# Patient Record
Sex: Female | Born: 1965 | Race: White | Hispanic: No | Marital: Married | Smoking: Former smoker
Health system: Southern US, Community
[De-identification: ages and names within clinical notes are randomized; demographics above are authoritative.]

## PROBLEM LIST (undated history)

## (undated) DIAGNOSIS — M545 Low back pain, unspecified: Secondary | ICD-10-CM

## (undated) HISTORY — PX: COLONOSCOPY: SHX174

## (undated) HISTORY — PX: TONSILLECTOMY: SUR1361

## (undated) HISTORY — PX: HYSTERECTOMY: SHX81

## (undated) HISTORY — DX: Low back pain, unspecified: M54.50

---

## 2002-08-09 HISTORY — PX: LIPOSUCTION: SHX10

## 2002-08-09 HISTORY — PX: ABDOMINOPLASTY: SUR9

## 2014-08-30 ENCOUNTER — Ambulatory Visit: Payer: Self-pay

## 2014-08-30 NOTE — Pre-Procedure Instructions (Signed)
   No testing ordered by surgeon   No anesthesia guidelines for testing

## 2014-09-04 ENCOUNTER — Ambulatory Visit: Payer: Self-pay | Admitting: Anesthesiology

## 2014-09-04 ENCOUNTER — Other Ambulatory Visit: Payer: Self-pay

## 2014-09-04 ENCOUNTER — Encounter: Payer: Self-pay | Admitting: Anesthesiology

## 2014-09-04 ENCOUNTER — Encounter
Admission: RE | Disposition: A | Discharge status: Home / Self Care | Payer: Self-pay | Source: Ambulatory Visit | Attending: Plastic Surgery

## 2014-09-04 ENCOUNTER — Ambulatory Visit
Admission: RE | Admit: 2014-09-04 | Discharge: 2014-09-04 | Disposition: A | Discharge status: Home / Self Care | Payer: Self-pay | Source: Ambulatory Visit | Attending: Plastic Surgery | Admitting: Plastic Surgery

## 2014-09-04 ENCOUNTER — Ambulatory Visit: Payer: Self-pay | Admitting: Plastic Surgery

## 2014-09-04 DIAGNOSIS — E881 Lipodystrophy, not elsewhere classified: Secondary | ICD-10-CM | POA: Insufficient documentation

## 2014-09-04 DIAGNOSIS — Z411 Encounter for cosmetic surgery: Secondary | ICD-10-CM | POA: Insufficient documentation

## 2014-09-04 DIAGNOSIS — Z87891 Personal history of nicotine dependence: Secondary | ICD-10-CM | POA: Insufficient documentation

## 2014-09-04 HISTORY — PX: LIPOSUCTION, FLANK (COSMETIC): SHX4745

## 2014-09-04 HISTORY — PX: ABDOMINOPLASTY, MINI (COSMETIC): SHX3012

## 2014-09-04 SURGERY — ABDOMINOPLASTY, MINI (COSMETIC)
Anesthesia: Anesthesia General | Site: Abdomen | Wound class: Clean

## 2014-09-04 MED ORDER — ROCURONIUM BROMIDE 50 MG/5ML IV SOLN
INTRAVENOUS | Status: DC | PRN
Start: 2014-09-04 — End: 2014-09-04
  Administered 2014-09-04: 50 mg via INTRAVENOUS

## 2014-09-04 MED ORDER — BUPIVACAINE HCL (PF) 0.5 % IJ SOLN
INTRAMUSCULAR | Status: DC
Start: 2014-09-04 — End: 2014-09-04
  Filled 2014-09-04: qty 20

## 2014-09-04 MED ORDER — GABAPENTIN 300 MG PO CAPS
ORAL_CAPSULE | ORAL | Status: AC
Start: 2014-09-04 — End: 2014-09-04
  Administered 2014-09-04: 300 mg via ORAL
  Filled 2014-09-04: qty 1

## 2014-09-04 MED ORDER — FENTANYL CITRATE 0.05 MG/ML IJ SOLN
INTRAMUSCULAR | Status: AC
Start: 2014-09-04 — End: ?
  Filled 2014-09-04: qty 2

## 2014-09-04 MED ORDER — OXYCODONE HCL ER 10 MG PO T12A
10.0000 mg | EXTENDED_RELEASE_TABLET | Freq: Once | ORAL | Status: AC
Start: 2014-09-04 — End: 2014-09-04

## 2014-09-04 MED ORDER — GENTAMICIN SULFATE 40 MG/ML IJ SOLN
INTRAMUSCULAR | Status: DC
Start: 2014-09-04 — End: 2014-09-04
  Filled 2014-09-04: qty 2

## 2014-09-04 MED ORDER — ACETAMINOPHEN 500 MG PO TABS
ORAL_TABLET | ORAL | Status: AC
Start: 2014-09-04 — End: 2014-09-04
  Administered 2014-09-04: 1000 mg via ORAL
  Filled 2014-09-04: qty 2

## 2014-09-04 MED ORDER — BUPIVACAINE HCL 0.5 % IJ SOLN
INTRAMUSCULAR | Status: DC | PRN
Start: 2014-09-04 — End: 2014-09-04
  Administered 2014-09-04: 4 mL
  Administered 2014-09-04: 15 mL

## 2014-09-04 MED ORDER — HYDROMORPHONE HCL 2 MG PO TABS
2.0000 mg | ORAL_TABLET | Freq: Once | ORAL | Status: AC | PRN
Start: 2014-09-04 — End: 2014-09-04

## 2014-09-04 MED ORDER — GABAPENTIN 300 MG PO CAPS
300.0000 mg | ORAL_CAPSULE | Freq: Once | ORAL | Status: AC
Start: 2014-09-04 — End: 2014-09-04

## 2014-09-04 MED ORDER — FAMOTIDINE 20 MG/2ML IV SOLN
INTRAVENOUS | Status: AC
Start: 2014-09-04 — End: 2014-09-04
  Administered 2014-09-04: 20 mg via INTRAVENOUS
  Filled 2014-09-04: qty 2

## 2014-09-04 MED ORDER — LIDOCAINE HCL 1 % IJ SOLN
INTRAMUSCULAR | Status: DC | PRN
Start: 2014-09-04 — End: 2014-09-04
  Administered 2014-09-04: 13:00:00 2000 mL via SUBCUTANEOUS
  Administered 2014-09-04: 14:00:00 1679 mL via SUBCUTANEOUS

## 2014-09-04 MED ORDER — HYDROMORPHONE HCL 2 MG PO TABS
ORAL_TABLET | ORAL | Status: AC
Start: 2014-09-04 — End: 2014-09-04
  Administered 2014-09-04: 2 mg via ORAL
  Filled 2014-09-04: qty 1

## 2014-09-04 MED ORDER — LIDOCAINE-EPINEPHRINE 1 %-1:100000 IJ SOLN
INTRAMUSCULAR | Status: DC
Start: 2014-09-04 — End: 2014-09-04
  Filled 2014-09-04: qty 20

## 2014-09-04 MED ORDER — MIDAZOLAM HCL 2 MG/2ML IJ SOLN
INTRAMUSCULAR | Status: DC | PRN
Start: 2014-09-04 — End: 2014-09-04
  Administered 2014-09-04: 2 mg via INTRAVENOUS

## 2014-09-04 MED ORDER — ACETAMINOPHEN 500 MG PO TABS
1000.0000 mg | ORAL_TABLET | Freq: Once | ORAL | Status: AC
Start: 2014-09-04 — End: 2014-09-04

## 2014-09-04 MED ORDER — LIDOCAINE HCL 1 % IJ SOLN
INTRAMUSCULAR | Status: DC
Start: 2014-09-04 — End: 2014-09-04
  Filled 2014-09-04: qty 40

## 2014-09-04 MED ORDER — MEPERIDINE HCL 25 MG/ML IJ SOLN
INTRAMUSCULAR | Status: AC
Start: 2014-09-04 — End: 2014-09-04
  Administered 2014-09-04: 12.5 mg via INTRAVENOUS
  Filled 2014-09-04: qty 1

## 2014-09-04 MED ORDER — ROCURONIUM BROMIDE 50 MG/5ML IV SOLN
INTRAVENOUS | Status: AC
Start: 2014-09-04 — End: ?
  Filled 2014-09-04: qty 5

## 2014-09-04 MED ORDER — CEFAZOLIN 1 GM MBP (CNR)
Status: DC
Start: 2014-09-04 — End: 2014-09-04
  Filled 2014-09-04: qty 100

## 2014-09-04 MED ORDER — OXYCODONE HCL ER 10 MG PO T12A
EXTENDED_RELEASE_TABLET | ORAL | Status: AC
Start: 2014-09-04 — End: 2014-09-04
  Administered 2014-09-04: 10 mg via ORAL
  Filled 2014-09-04: qty 1

## 2014-09-04 MED ORDER — PROPOFOL 10 MG/ML IV EMUL
INTRAVENOUS | Status: DC | PRN
Start: 2014-09-04 — End: 2014-09-04
  Administered 2014-09-04: 150 mg via INTRAVENOUS

## 2014-09-04 MED ORDER — PROMETHAZINE HCL 25 MG/ML IJ SOLN
6.2500 mg | Freq: Once | INTRAMUSCULAR | Status: DC | PRN
Start: 2014-09-04 — End: 2014-09-04

## 2014-09-04 MED ORDER — EPINEPHRINE HCL 1 MG/ML IJ SOLN
INTRAMUSCULAR | Status: DC
Start: 2014-09-04 — End: 2014-09-04
  Filled 2014-09-04: qty 3

## 2014-09-04 MED ORDER — CEFADROXIL 500 MG PO CAPS
ORAL_CAPSULE | ORAL | 0 refills | Status: DC
Start: 2014-09-04 — End: 2017-11-24
  Filled 2014-09-04: qty 14, 7d supply, fill #0

## 2014-09-04 MED ORDER — GLYCOPYRROLATE 0.2 MG/ML IJ SOLN
INTRAMUSCULAR | Status: DC | PRN
Start: 2014-09-04 — End: 2014-09-04
  Administered 2014-09-04: 0.2 mg via INTRAVENOUS

## 2014-09-04 MED ORDER — MEPERIDINE HCL 25 MG/ML IJ SOLN
12.5000 mg | Freq: Once | INTRAMUSCULAR | Status: AC | PRN
Start: 2014-09-04 — End: 2014-09-04

## 2014-09-04 MED ORDER — SODIUM CHLORIDE 0.9 % IV MBP
1.0000 g | Freq: Three times a day (TID) | INTRAVENOUS | Status: DC
Start: 2014-09-04 — End: 2014-09-04

## 2014-09-04 MED ORDER — LACTATED RINGERS IV SOLN
INTRAVENOUS | Status: DC
Start: 2014-09-04 — End: 2014-09-04
  Administered 2014-09-04: 1000 mL via INTRAVENOUS

## 2014-09-04 MED ORDER — HYDROMORPHONE HCL 1 MG/ML IJ SOLN
0.5000 mg | INTRAMUSCULAR | Status: DC | PRN
Start: 2014-09-04 — End: 2014-09-04

## 2014-09-04 MED ORDER — FENTANYL CITRATE 0.05 MG/ML IJ SOLN
INTRAMUSCULAR | Status: DC | PRN
Start: 2014-09-04 — End: 2014-09-04
  Administered 2014-09-04: 100 ug via INTRAVENOUS
  Administered 2014-09-04: 50 ug via INTRAVENOUS

## 2014-09-04 MED ORDER — LACTATED RINGERS IV SOLN
75.0000 mL/h | INTRAVENOUS | Status: DC
Start: 2014-09-04 — End: 2014-09-04

## 2014-09-04 MED ORDER — SCOPOLAMINE 1 MG/3DAYS TD PT72
MEDICATED_PATCH | TRANSDERMAL | Status: DC
Start: 2014-09-04 — End: 2014-09-04
  Filled 2014-09-04: qty 1

## 2014-09-04 MED ORDER — LIDOCAINE HCL 1 % IJ SOLN
INTRAMUSCULAR | Status: DC
Start: 2014-09-04 — End: 2014-09-04
  Filled 2014-09-04: qty 20

## 2014-09-04 MED ORDER — LACTATED RINGERS IV SOLN
INTRAVENOUS | Status: DC | PRN
Start: 2014-09-04 — End: 2014-09-04

## 2014-09-04 MED ORDER — FAMOTIDINE 10 MG/ML IV SOLN (WRAP)
20.0000 mg | Freq: Two times a day (BID) | INTRAVENOUS | Status: DC
Start: 2014-09-04 — End: 2014-09-04

## 2014-09-04 MED ORDER — HYDROCODONE-ACETAMINOPHEN 5-325 MG PO TABS
ORAL_TABLET | ORAL | 0 refills | Status: DC
Start: 2014-09-04 — End: 2017-11-24
  Filled 2014-09-04: qty 30, 4d supply, fill #0

## 2014-09-04 MED ORDER — SODIUM CHLORIDE 0.9 % IR SOLN
Status: DC | PRN
Start: 2014-09-04 — End: 2014-09-04
  Administered 2014-09-04: 1000 mL

## 2014-09-04 MED ORDER — GLYCOPYRROLATE 0.2 MG/ML IJ SOLN
INTRAMUSCULAR | Status: AC
Start: 2014-09-04 — End: ?
  Filled 2014-09-04: qty 1

## 2014-09-04 MED ORDER — FENTANYL CITRATE 0.05 MG/ML IJ SOLN
25.0000 ug | INTRAMUSCULAR | Status: DC | PRN
Start: 2014-09-04 — End: 2014-09-04

## 2014-09-04 MED ORDER — MIDAZOLAM HCL 2 MG/2ML IJ SOLN
INTRAMUSCULAR | Status: AC
Start: 2014-09-04 — End: ?
  Filled 2014-09-04: qty 2

## 2014-09-04 MED ORDER — PROPOFOL 10 MG/ML IV EMUL
INTRAVENOUS | Status: AC
Start: 2014-09-04 — End: ?
  Filled 2014-09-04: qty 20

## 2014-09-04 MED ORDER — EPINEPHRINE HCL 1 MG/ML IJ SOLN
INTRAMUSCULAR | Status: DC
Start: 2014-09-04 — End: 2014-09-04
  Filled 2014-09-04: qty 2

## 2014-09-04 MED ORDER — LIDOCAINE HCL 1 % IJ SOLN
INTRAMUSCULAR | Status: DC
Start: 2014-09-04 — End: 2014-09-04
  Filled 2014-09-04: qty 100

## 2014-09-04 MED ORDER — LIDOCAINE-EPINEPHRINE 1 %-1:100000 IJ SOLN
INTRAMUSCULAR | Status: DC | PRN
Start: 2014-09-04 — End: 2014-09-04
  Administered 2014-09-04: 4 mL
  Administered 2014-09-04: 15 mL

## 2014-09-04 MED ORDER — ONDANSETRON HCL 4 MG/2ML IJ SOLN
INTRAMUSCULAR | Status: AC
Start: 2014-09-04 — End: ?
  Filled 2014-09-04: qty 2

## 2014-09-04 MED ORDER — CEFAZOLIN SODIUM 1 G IJ SOLR
INTRAMUSCULAR | Status: AC
Start: 2014-09-04 — End: 2014-09-04
  Administered 2014-09-04: 1 g via INTRAVENOUS
  Filled 2014-09-04: qty 1000

## 2014-09-04 MED ORDER — ONDANSETRON HCL 4 MG/2ML IJ SOLN
4.0000 mg | Freq: Once | INTRAMUSCULAR | Status: DC | PRN
Start: 2014-09-04 — End: 2014-09-04

## 2014-09-04 SURGICAL SUPPLY — 74 items
APPLCATOR CHLORAPREP 26ML (Prep) ×6 IMPLANT
BANDAGE STERI-STRIP 0.5X4IN (Dressing) ×1
BINDER ABD 12 WIDE 4 PANELS (Patient Supply) IMPLANT
BINDER ABD 21 WIDE 4 PANELS (Patient Supply) IMPLANT
BINDER ABDMNL 4 PANEL 46 TO 62 (Patient Supply) IMPLANT
BINDER ABDMNL 4 PANEL 55 TO 72 (Patient Supply) IMPLANT
BLADE ELECTRODE EXTENDED 6IN (Cautery) ×3 IMPLANT
BLADE ELECTRODE INSULATED (Cautery) ×1
BLADE S/SU RIBBACK CARB STL 11 (Blade) ×3 IMPLANT
BLADE SURG 10 (Blade) ×3 IMPLANT
CANNISTER SUCTION 3000CC (Suction) ×3 IMPLANT
CATHETER FOLEY KT 16FR (Catheter Micellaneous)
CATHETER FOLEY KT 16FR (Catheter Miscellaneous) IMPLANT
CLOSURE STERI-STRIP 1X5IN (Dressing) ×12 IMPLANT
DRAIN BLAKE RND 15F SILI HBLSS (Drain) ×1
DRAIN OD15 FR 4 CHANNEL RADIOPAQUE (Drain) ×2
DRAIN OD15 FR 4 CHANNEL RADIOPAQUE HUBLESS SOLID CORE CENTER BLAKE (Drain) ×2 IMPLANT
DRAPE 3/4 SHEET FANFLD 52X76IN (Drape) IMPLANT
DRESSING PETRO 3% BI 3BRM GZE XR 9X5IN (Dressing) ×2
DRESSING PETROLATUM XEROFORM L9 IN X W5 IN 3% BISMUTH TRIBROMOPHENATE (Dressing) ×2 IMPLANT
DRESSING TEGADERM 4X4X3/4IN (Dressing) ×4
DRESSING TEGADERM FRAME 6X7CM (Dressing) ×2
DRESSING TRANSPARENT L2 3/4 IN X W2 3/8 (Dressing) ×4
DRESSING TRANSPARENT L2 3/4 IN X W2 3/8 IN POLYURETHANE ADHESIVE (Dressing) ×4 IMPLANT
DRESSING TRANSPARENT L4 3/4 IN X W4 IN (Dressing) ×8
DRESSING TRANSPARENT L4 3/4 IN X W4 IN POLYURETHANE ADHESIVE (Dressing) ×8 IMPLANT
DRESSING XEROFORM 5X9IN (Dressing) ×1
ELECTRODE ELECTROSURGICAL BLADE L4 IN (Cautery) ×2
ELECTRODE ELECTROSURGICAL BLADE L4 IN OD3/32 IN EDGE L.2 IN INSULATE (Cautery) ×2 IMPLANT
EVACUATOR WOUND SILICONE 100CC (Drain) ×6 IMPLANT
GLOVE SURG BIOGEL SENSE SZ 7.5 (Glove) ×3 IMPLANT
INACTIVE USE LAWSON 120900 (Gown) ×3 IMPLANT
MASTISOL VIAL 2/3CC STRL (Skin Closure) ×6 IMPLANT
NEEDLE NOKOR FILTER 19GX1.5IN (Needles) ×3 IMPLANT
NEEDLE REG BEVEL 19GX1.5IN (Needles) ×6 IMPLANT
PACK MAJOR PLASTIC (Pack) ×3 IMPLANT
PACK SURGICAL UNIVERSAL ABSORBENT (Pack)
PACK SURGICAL UNIVERSAL ABSORBENT REINFORCE ADHESIVE CORD HOLD STRAP (Pack) IMPLANT
PACK UNIVERSAL PROCED (Pack)
PAD ABD PVC CRTY 9X5IN LF STRL 3 LYR (Dressing) ×8 IMPLANT
PAD ELECTROSRG GRND REM W CRD (Procedure Accessories) ×3 IMPLANT
PAD PREP CUFF 24X41IN W 9IN (Prep) ×6 IMPLANT
PAD TENDRSRB ABD STRL 5X9IN (Dressing) ×4
ROLL COTTON STERILE (Dressing) IMPLANT
SLEEVE SEQUEN COMP KNEE REG (Procedure Accessories) ×3 IMPLANT
SOL NACL.9% 1000ML IRR NONLTX (Irrigation Solutions) ×1
SOLUTION CHG SCRUB 2% 4OZ (Prep) ×1
SOLUTION IRRIGATION 0.9% SODIUM CHLORIDE (Irrigation Solutions) ×2
SOLUTION IRRIGATION 0.9% SODIUM CHLORIDE 1000 ML PLASTIC POUR BOTTLE (Irrigation Solutions) ×2 IMPLANT
SOLUTION SRGSCRB 2% CHG 4OZ EXDN BTL (Prep) ×2
SOLUTION SURGICAL SCRUB EXIDINE 2% CHG 4OZ BOTTLE (Prep) ×2 IMPLANT
SPONGE LAP XRAY 18X18IN (Sponge)
SPONGE LAPAROTOMY L18 IN X W18 IN (Sponge)
SPONGE LAPAROTOMY L18 IN X W18 IN PREWASH WHITE (Sponge) IMPLANT
STOCKINETTE IMPRVIOUS LRG 9X48 (Procedure Accessories) IMPLANT
STRIP SKIN CLOSURE L4 IN X W1/2 IN (Dressing) ×2
STRIP SKIN CLOSURE L4 IN X W1/2 IN REINFORCE STERI-STRIP POLYESTER (Dressing) ×2 IMPLANT
SUTURE MONOCRYL 5-0 (Suture)
SUTURE MONOCRYL 5-0 TF L27 IN (Suture)
SUTURE MONOCRYL 5-0 TF L27 IN MONOFILAMENT UNDYED ABSORBABLE (Suture) IMPLANT
SUTURE VICRYL 5-0 PS2 18IN (Suture) IMPLANT
SYRINGE 20 ML BD LUER-LOK MEDICAL (Syringes, Needles) ×2 IMPLANT
SYRINGE 50 ML GRADUATE NONPYROGENIC DEHP (Syringes, Needles) ×2
SYRINGE 50 ML GRADUATE NONPYROGENIC DEHP FREE PVC FREE LOK MEDICAL (Syringes, Needles) ×2 IMPLANT
SYRINGE LUER LOCK 10CC (Syringes, Needles) ×3 IMPLANT
SYRINGE LUER LOK 50ML (Syringes, Needles) ×1
SYRINGE LUER-LOK STERILE 20CC (Syringes, Needles) ×1
SYRINGE MED 20ML LL LF STRL (Syringes, Needles) ×2
TOWEL STERILE REUSABLE 8PK (Procedure Accessories) ×3 IMPLANT
TRAY SKIN DRY SCRUB (Tray) ×3 IMPLANT
TUBING ASPIRATION 3/8X12 (Tubing) ×1
TUBING LAMIS LIPOSUCTION (Tubing) ×3 IMPLANT
TUBING SUCTION L12 FT PSI-TEC (Tubing) ×2 IMPLANT
WRAP COBAN SELF ADHRNT 6INX5YD (Procedure Accessories) IMPLANT

## 2014-09-04 NOTE — Brief Op Note (Signed)
Brief OP Note  Pre OP:  Abdominolipodystrophy   Lipodystrophy lateral flanks, bra line and hips   Cosmetic  Post OP: Same  Procedure:   Liposuction lateral and posterior flanks, bra line and hips   Liposuction abdomen   Mini-Abdominoplasty    Surgeon:  Audrie Gallus  Anesthesia:  General  Findings:  4000 cc tumescent fluid    5300 cc fatty aspirate    Patient tolerated procedure well

## 2014-09-04 NOTE — Transfer of Care (Signed)
Anesthesia Transfer of Care Note    Patient: Alexandra Parker    Procedures performed: Procedure(s) with comments:  ABDOMINOPLASTY, MINI (COSMETIC) - MINI ABDOMINOPLASTY W/ LIPOSUCTION ABDOMEN(COSMETIC)  LIPOSUCTION, FLANK (COSMETIC) - LIPOSUCTION,  FLANKS AND BRA LINE (COSMETIC)    Anesthesia type: General ETT    Patient location:Phase I PACU    Last vitals:   Filed Vitals:    09/04/14 0917   Pulse: 77   Temp: 36.7 C (98 F)   Resp: 16   SpO2: 99%       Post pain: Patient not complaining of pain, continue current therapy      Mental Status:sedated    Respiratory Function: tolerating face mask    Cardiovascular: stable    Nausea/Vomiting: patient not complaining of nausea or vomiting    Hydration Status: adequate    Post assessment: no apparent anesthetic complications

## 2014-09-04 NOTE — OR PreOp (Signed)
Pt told that Dr. Filbert Schilder is running late, approx. 30-45 minutes

## 2014-09-04 NOTE — PACU (Signed)
Pt. Ambulated and tolerated well, assisted up to chairPt. Voided without difficulty.Pt. Tolerated crackers and fluids well.

## 2014-09-04 NOTE — H&P (Signed)
ADMISSION HISTORY AND PHYSICAL EXAM    Date Time: 09/04/2014 8:58 AM  Patient Name: Alexandra Parker,Alexandra Parker  Attending Physician: Guilford Shi, MD    Assessment:   Stable for OR    Plan:   Mini Abdominoplasty and liposuction of the bra line and lateral flanks    History of Present Illness:   Alexandra Parker is a healthy 49 y.o. female who presents to the hospital for a mini abdominoplasty and liposuction of the lateral flanks and bra line.     Past Medical History:     Past Medical History   Diagnosis Date   . Low back pain        Past Surgical History:     Past Surgical History   Procedure Laterality Date   . Cesarean section       x 3   . Hysterectomy     . Tonsillectomy     . Abdominoplasty     . Liposuction         Family History:   History reviewed. No pertinent family history.    Social History:     History     Social History   . Marital Status: N/A     Spouse Name: N/A     Number of Children: N/A   . Years of Education: N/A     Social History Main Topics   . Smoking status: Former Smoker -- 0.50 packs/day for 5 years   . Smokeless tobacco: Never Used      Comment: Quit 2006   . Alcohol Use: 1.8 oz/week     3 Glasses of wine per week   . Drug Use: No   . Sexual Activity: Not on file     Other Topics Concern   . Not on file     Social History Narrative   . No narrative on file       Allergies:   No Known Allergies    Medications:     No prescriptions prior to admission       Review of Systems:   A comprehensive review of systems was: Negative except Review of systems is Negative    Physical Exam:   There were no vitals filed for this visit.    Intake and Output Summary (Last 24 hours) at Date Time  No intake or output data in the 24 hours ending 09/04/14 0858    General appearance - alert, well appearing, and in no distress   Chest - clear to ascultation bilaterally throughout; symmetric airway entry bilaterally. No wheezes, rhonchi, or rales  Heart - Regular rhythm, normal rate. S1S2 present. No murmurs, rubs, or  clicks.    Labs:     Results     ** No results found for the last 24 hours. **          Recent CBC No results for input(s): RBC, HGB, HCT, MCV, MCH, MCHC, RDW, MPV, LABPLAT in the last 24 hours.    Invalid input(s): WHITEBLOODCE,  NRBCA,  REFLX,  ANRBA    Rads:   Radiological Procedure reviewed.    Signed by: Mahalia Longest

## 2014-09-04 NOTE — Anesthesia Postprocedure Evaluation (Addendum)
Anesthesia Post Evaluation    Patient: Alexandra Parker    Procedure(s) with comments:  ABDOMINOPLASTY, MINI (COSMETIC) - MINI ABDOMINOPLASTY W/ LIPOSUCTION ABDOMEN(COSMETIC)  LIPOSUCTION, FLANK (COSMETIC) - LIPOSUCTION,  FLANKS AND BRA LINE (COSMETIC)    Anesthesia type: general    Last Vitals:   Filed Vitals:    09/04/14 1640   BP: 144/77   Pulse: 86   Temp:    Resp: 16   SpO2: 97%       Patient Location: Phase II PACU      Post Pain: Patient not complaining of pain, continue current therapy    Mental Status: awake and alert    Respiratory Function: tolerating room air    Cardiovascular: stable    Nausea/Vomiting: patient not complaining of nausea or vomiting    Hydration Status: adequate    Post Assessment: no apparent anesthetic complications and no reportable events          Anesthesia Qualified Clinical Data Registry    1.  CVC insertion : NO                                               2.  General/neuraxial anesthesia > or = 60 minutes (excluding CABG) : YES              > Use of intraoperative active warming : YES              > Temperature > or = 36 degrees Centigrade (96.8 degrees Farenheit) during time span from 30 minutes before up to 15 minutes after anesthesia end time : YES      3.  Age > or = 18, with IV access, with surgical procedure for which antibiotic prophylaxis indicated, and not on chronic antibiotics : NO                  4.  Ordering or administration of drug inconsistent with intended drug, dose, delivery or timing : NO      5.  Dental injury with administration of anesthesia : NO      6.  Elective airway procedure including but not limited to: tracheostomy, fiberoptic bronchoscopy, rigid bronchoscopy; jet ventilation; or elective use of a device to facilitate airway management such as a Glidescope : NO                > Unanticipated difficult intubation post pre-evaluation : NO      7.  Aspiration of gastric contents : NO                    8.  Procedure requiring electrocautery/laser : NO                     9.  Cardiac arrest in OR or PACU : NO                    10.  Unplanned hospital admission for initially intended outpatient anesthesia service : NO      11.  Unplanned ICU admission related to anesthesia occurring within 24 hours of induction or start of MAC : NO      12.  Cancellation of procedure after care already started by anesthesia care team : NO      13.  Transfer from OR or PACU upon case conclusion :  YES              > Use of PACU transfer checklist/protocol (includes the key elements of: patient identification, responsible practitioner identification (PACU nurse or advanced practitioner), discussion of pertinent history and procedure course, intraoperative anesthetic management, post-procedure plans, acknowledgement/questions) : YES      14.  Transfer from OR or ICU upon case conclusion : NO                    15. Post-operative nausea/vomiting risk protocol. Patient > or = 18 with care initiated by anesthesia team that has a risk factor screen for post-op nausea/vomiting (Includes female, hx PONV, or motion sickness, non-smoker, intended opioid administration for post-op analgesia.) : YES    16.  Anaphylaxis secondary to anesthesia : NO      17.  Suspected transfusion reaction in association with blood-bank confirmed product incompatibility: NO        Marcelo Baldy, 09/04/2014 5:13 PM

## 2014-09-04 NOTE — Anesthesia Preprocedure Evaluation (Signed)
Anesthesia Evaluation    AIRWAY    Mallampati: II    TM distance: >3 FB  Neck ROM: full  Mouth Opening:full   CARDIOVASCULAR    cardiovascular exam normal       DENTAL         PULMONARY    pulmonary exam normal     OTHER FINDINGS                      Anesthesia Plan    ASA 1     general                                 informed consent obtained

## 2014-09-04 NOTE — Discharge Instructions (Signed)
Ambulate as frequently as tolerated  Normal Diet  Pain meds as needed   Change dressings as needed  Call for any concerns  Dr Filbert Schilder cell [phone] or office [phone]    Anticipate drainage from incisions for the first 24 hrs.    Follow up appointment next week          Discharge Instructions: After Your Surgery  You've just had surgery. During surgery you were given medicine called anesthesia to keep you relaxed and free of pain. After surgery you may have some pain or nausea. This is common. Here are some tips for feeling better and getting well after surgery.    Going home  Your doctor or nurse will show you how to take care of yourself when you go home. He or she will also answer your questions. Have an adult family member or friend drive you home. For the first 24 hours after your surgery:   Do not drive or use heavy equipment.   Do not make important decisions or sign legal papers.   Do not drink alcohol.   Have someone stay with you, if needed. He or she can watch for problems and help keep you safe.  Be sure to go to all follow-up visits with your doctor. And rest after your surgery for as long as your doctor tells you to.  Coping with pain  If you have pain after surgery, pain medicine will help you feel better. Take it as told, before pain becomes severe. Also, ask your doctor or pharmacist about other ways to control pain. This might be with heat, ice, or relaxation. And follow any other instructions your surgeon or nurse gives you.  Tips for taking pain medicine  To get the best relief possible, remember these points:   Pain medicines can upset your stomach. Taking them with a little food may help.   Most pain relievers taken by mouth need at least 20 to 30 minutes to start to work.   Taking medicine on a schedule can help you remember to take it. Try to time your medicine so that you can take it before starting an activity. This might be before you get dressed, go for a walk, or sit down  for dinner.   Constipation is a common side effect of pain medicines. Call your doctor before taking any medicines such as laxatives or stool softeners to help ease constipation. Also ask if you should skip any foods. Drinkinglots of fluids andeating foodssuch as fruits and vegetables that are high in fiber can also help. Remember, do not take laxatives unless your surgeon has prescribed them.   Drinking alcohol and taking pain medicine can cause dizziness and slow your breathing. It can even be deadly. Do not drink alcohol while taking pain medicine.   Pain medicine can make you react more slowly to things. Do not drive or run machinery while taking pain medicine.  Your health care providermay tell you to take acetaminophen to help ease your pain. Ask him or her how much you are supposed to take each day. Acetaminophen or other pain relievers may interact with your prescription medicines or other over-the-counter (OTC) drugs. Some prescription medicines have acetaminophen and other ingredients.Using both prescription and OTC acetaminophenfor paincan cause you to overdose. Readthe labels on your OTC medicineswith care. This will help youto clearly know the list of ingredients, how much to take, and anywarnings. It may also help you not take too muchacetaminophen.If you have questions or do  not understand the information, ask your pharmacist or health care provider to explain it to you before you take the OTC medicine.  Managing nausea  Some people have an upset stomach after surgery. This is often because of anesthesia, pain, or pain medicine, or the stress of surgery. These tips will help you handle nausea and eat healthy foods as you get better. If you were on a special food plan before surgery, ask your doctor if you should follow it while you get better. These tips may help:   Do not push yourself to eat. Your body will tell you when to eat and how much.   Start off with clear liquids and soup.  They are easier to digest.   Next try semi-solid foods, such as mashed potatoes, applesauce, and gelatin, as you feel ready.   Slowly move to solid foods. Don't eat fatty, rich, or spicy foods at first.   Do not force yourself to have 3 large meals a day. Instead eat smaller amounts more often.   Take pain medicines with a small amount of solid food, such as crackers or toast, to avoid nausea.         If you have obstructive sleep apnea  You were given anesthesia medicine during surgery to keep you comfortable and free of pain. After surgery, you may have more apnea spells because of this medicine and other medicines you were given. The spells may last longer than usual.  At home:   Keep using the continuous positive airway pressure (CPAP) device when you sleep. Unless your health care provider tells you not to, use it when you sleep, day or night. CPAP is a common device used to treat obstructive sleep apnea.   Talk with your provider before taking any pain medicine, muscle relaxants, or sedatives. Your provider will tell you about the possible dangers of taking these medicines.   [address] [employer] [address], [place], [place] [address]. All rights reserved. This information is not intended as a substitute for professional medical care. Always follow your healthcare professional's instructions.

## 2014-09-05 ENCOUNTER — Encounter: Payer: Self-pay | Admitting: Plastic Surgery

## 2014-09-10 NOTE — Op Note (Signed)
Procedure Date: 09/04/2014     Patient Type: A     SURGEON: Guilford Shi MD  ASSISTANT:       PREOPERATIVE DIAGNOSES:  1.  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  2.  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  3.  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     POSTOPERATIVE DIAGNOSES:   1.  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  2.  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  3.  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     TITLE OF PROCEDURE:  1.  Liposuction of the abdomen, lateral flanks and sub-bra line and  posterior hips.  2.  Mini abdominoplasty.  3.  Revision of abdominoplasty scar lateral flanks.     ANESTHESIA:  General endotracheal.     INDICATIONS:  Alexandra Parker is a 49 year old female who presents for elective  liposuction for the torso to improve the appearance.  She also has a  previous abdominoplasty scar that is elevated and unsightly for revision.   This will be accomplished by means of the mini abdominoplasty.  We  carefully reviewed the procedure, risks, and complications with for her  consideration.  She understands, agrees, and consents to the procedure that  follows.     DESCRIPTION OF PROCEDURE:  The patient was carefully marked in the preoperative holding area of  anatomical landmarks and areas of lipodystrophy as well as the positions of  the new incisions.  She was then brought to the operating room where she  was comfortably placed on the surgical table.  After establishing IV access  and general endotracheal intubation, the patient was then repositioned onto  a comfortable prone position.  Pneumatic stockings had been placed prior to  the induction of anesthesia and were activated and IV antibiotics were  infused.  The areas of the posterior flanks and the posterior back and hips  were draped and prepped in sterile fashion with  chlorhexidine and alcohol.   Tumescent solution was then infused throughout the areas of lipodystrophy  to be addressed in today's procedure.  Following the infusion of  lipodystrophy, stab incisions were made, in this case in her previous  abdominoplasty scar and other strategic locations, through which standard  liposuction was performed until satisfactory reduction and contouring in  the areas of concern had been achieved.  The output from each side was  carefully monitored and compared with the opposite side for symmetry.   Following reduction to satisfaction, the incisions were closed in layers  with 5-0 Monocryl and 5-0 nylon suture.     The patient was now repositioned into the supine position under the  directions of anesthesia, the arms placed comfortably on arm boards.  The  abdomen and the mons pubis area were then draped and prepped in sterile  fashion with chlorhexidine and alcohol.  Once again, strategic incisions  were made in the old abdominoplasty scar through which tumescent solution  was infused throughout the anterior lateral flanks and abdomen as well as  in to the mons pubis area and the areas surrounding the previous  abdominoplasty incision.  Standard liposuction was then performed  throughout the abdomen and lateral flanks in a similar fashion until  satisfactory reduction had been achieved.  Input and output of fatty  aspirate was carefully monitored for bilateral symmetry.  Attention was  also directed towards  the mons pubis area where the lipodystrophy was  reduced to satisfaction.     We now directed our attention to the lower abdominoplasty incision and the  redundancy in the central lower abdomen.  Her previous hysterectomy scar  was approximately 3 cm lower than her abdominoplasty scar.  The outline for  the surgical incision was then tapered along the lower line encompassing  the lower abdominal hysterectomy scar, the upper margin being the previous  border of the abdominoplasty  incision.  The skin incisions were then  sharply incised from the lateral corners below the inferior margin and then  extending along the upper border of the scar of the abdominoplasty.  The  intervening tissue and scar was then excised.  Hemostasis was obtained with  electrocautery.  The skin flap was then elevated along the fascia of the  abdominal wall up to the umbilicus and then extended slightly laterally and  superior to both sides of the umbilicus.  Hemostasis was obtained with  electrocautery throughout.  The flap was now redraped inferiorly, leaving  the umbilicus intact.  With advancement of the flap reapproximation to the  lower abdominal wall was accomplished with 2-0 Monocryl with the Scarpa's  fascia, and 3-0 Monocryl was used to reapproximate the dermis of the skin  flap.  A running subcuticular 4-0 Monocryl completed closure of the skin  approximation.  No drain was used.       Dressings were then applied and a compressive garment was also placed.  The  patient was then taken to recovery having tolerated the procedure well in  satisfactory condition.  Sponge and needle count reported correct.  A total  of 3680 mL of tumescent solution was infused and 5300 mL of fatty aspirate  was returned.           D:  09/10/2014 16:23 PM by Dr. Guilford Shi, MD [phone])  T:  09/10/2014 21:10 PM by       Everlean Cherry: [id number]) (Doc ID: [id number])

## 2019-06-06 HISTORY — PX: WRIST SURGERY: SHX841

## 2020-02-06 ENCOUNTER — Telehealth: Payer: Self-pay

## 2020-02-06 ENCOUNTER — Encounter: Payer: Self-pay | Admitting: Plastic Surgery

## 2020-02-06 NOTE — Pre-Procedure Instructions (Signed)
PEC phone interview done with pt.   No Pre-op testing requested per surgeon per pt or required per anesthesia guidelines

## 2020-02-21 ENCOUNTER — Ambulatory Visit
Admission: RE | Admit: 2020-02-21 | Discharge: 2020-02-21 | Disposition: A | Discharge status: Home / Self Care | Payer: Self-pay | Source: Ambulatory Visit | Attending: Plastic Surgery | Admitting: Plastic Surgery

## 2020-02-21 ENCOUNTER — Encounter: Payer: Self-pay | Admitting: Plastic Surgery

## 2020-02-21 ENCOUNTER — Ambulatory Visit: Payer: Self-pay | Admitting: Anesthesiology

## 2020-02-21 ENCOUNTER — Encounter
Admission: RE | Disposition: A | Discharge status: Home / Self Care | Payer: Self-pay | Source: Ambulatory Visit | Attending: Plastic Surgery

## 2020-02-21 DIAGNOSIS — Z411 Encounter for cosmetic surgery: Secondary | ICD-10-CM | POA: Insufficient documentation

## 2020-02-21 DIAGNOSIS — E881 Lipodystrophy, not elsewhere classified: Secondary | ICD-10-CM | POA: Insufficient documentation

## 2020-02-21 HISTORY — PX: LIPOSUCTION, ABDOMEN (COSMETIC): SHX4732

## 2020-02-21 HISTORY — PX: LIPOSUCTION, FLANK (COSMETIC): SHX4745

## 2020-02-21 HISTORY — PX: LIPOSUCTION, BACK (COSMETIC): SHX4738

## 2020-02-21 HISTORY — PX: LIPOSUCTION, THIGHS (COSMETIC): SHX4757

## 2020-02-21 SURGERY — LIPOSUCTION, ABDOMEN (COSMETIC)
Anesthesia: Anesthesia General | Site: Thigh | Wound class: Clean

## 2020-02-21 MED ORDER — MEPERIDINE HCL 25 MG/ML IJ SOLN
25.0000 mg | INTRAMUSCULAR | Status: DC | PRN
Start: 2020-02-21 — End: 2020-02-21

## 2020-02-21 MED ORDER — FENTANYL CITRATE (PF) 50 MCG/ML IJ SOLN (WRAP)
INTRAMUSCULAR | Status: DC | PRN
Start: 2020-02-21 — End: 2020-02-21
  Administered 2020-02-21: 100 ug via INTRAVENOUS

## 2020-02-21 MED ORDER — PROPOFOL 10 MG/ML IV EMUL (WRAP)
INTRAVENOUS | Status: AC
Start: 2020-02-21 — End: ?
  Filled 2020-02-21: qty 20

## 2020-02-21 MED ORDER — LIDOCAINE-EPINEPHRINE 1 %-1:100000 IJ SOLN
INTRAMUSCULAR | Status: AC
Start: 2020-02-21 — End: ?
  Filled 2020-02-21: qty 60

## 2020-02-21 MED ORDER — MAGNESIUM SULFATE 50 % IJ SOLN
INTRAMUSCULAR | Status: DC | PRN
Start: 2020-02-21 — End: 2020-02-21
  Administered 2020-02-21: 2 g via INTRAVENOUS

## 2020-02-21 MED ORDER — MAGNESIUM SULFATE 50 % IJ SOLN
INTRAMUSCULAR | Status: AC
Start: 2020-02-21 — End: ?
  Filled 2020-02-21: qty 2

## 2020-02-21 MED ORDER — DEXAMETHASONE SODIUM PHOSPHATE 4 MG/ML IJ SOLN (WRAP)
INTRAMUSCULAR | Status: DC | PRN
Start: 2020-02-21 — End: 2020-02-21
  Administered 2020-02-21: 4 mg via INTRAVENOUS

## 2020-02-21 MED ORDER — NEOSTIGMINE METHYLSULFATE 1 MG/ML IJ/IV SOLN (WRAP)
Status: DC | PRN
Start: 2020-02-21 — End: 2020-02-21
  Administered 2020-02-21: 3 mg via INTRAVENOUS

## 2020-02-21 MED ORDER — GABAPENTIN 100 MG PO CAPS
ORAL_CAPSULE | ORAL | Status: AC
Start: 2020-02-21 — End: ?
  Filled 2020-02-21: qty 1

## 2020-02-21 MED ORDER — FENTANYL CITRATE (PF) 50 MCG/ML IJ SOLN (WRAP)
INTRAMUSCULAR | Status: AC
Start: 2020-02-21 — End: ?
  Filled 2020-02-21: qty 2

## 2020-02-21 MED ORDER — BUPIVACAINE HCL (PF) 0.25 % IJ SOLN
INTRAMUSCULAR | Status: AC
Start: 2020-02-21 — End: ?
  Filled 2020-02-21: qty 60

## 2020-02-21 MED ORDER — DEXAMETHASONE SODIUM PHOSPHATE 4 MG/ML IJ SOLN
INTRAMUSCULAR | Status: AC
Start: 2020-02-21 — End: ?
  Filled 2020-02-21: qty 1

## 2020-02-21 MED ORDER — LIDOCAINE HCL 1 % IJ SOLN
INTRAMUSCULAR | Status: AC
Start: 2020-02-21 — End: ?
  Filled 2020-02-21: qty 40

## 2020-02-21 MED ORDER — LACTATED RINGERS IV SOLN
INTRAVENOUS | Status: DC | PRN
Start: 2020-02-21 — End: 2020-02-21
  Administered 2020-02-21: 08:00:00 2750 mL via SUBCUTANEOUS
  Administered 2020-02-21: 10:00:00 1500 mL via SUBCUTANEOUS
  Administered 2020-02-21: 09:00:00 750 mL via SUBCUTANEOUS

## 2020-02-21 MED ORDER — KETAMINE HCL 50 MG/ML IJ SOLN
INTRAMUSCULAR | Status: DC | PRN
Start: 2020-02-21 — End: 2020-02-21
  Administered 2020-02-21: 25 mg via INTRAVENOUS

## 2020-02-21 MED ORDER — LACTATED RINGERS IV BOLUS
1000.0000 mL | Freq: Once | INTRAVENOUS | Status: AC
Start: 2020-02-21 — End: 2020-02-21
  Administered 2020-02-21: 13:00:00 1000 mL via INTRAVENOUS

## 2020-02-21 MED ORDER — HYDROMORPHONE HCL 1 MG/ML IJ SOLN
0.5000 mg | INTRAMUSCULAR | Status: DC | PRN
Start: 2020-02-21 — End: 2020-02-21

## 2020-02-21 MED ORDER — PROPOFOL INFUSION 10 MG/ML
INTRAVENOUS | Status: DC | PRN
Start: 2020-02-21 — End: 2020-02-21
  Administered 2020-02-21: 250 mg via INTRAVENOUS

## 2020-02-21 MED ORDER — LIDOCAINE HCL (PF) 2 % IJ SOLN
INTRAMUSCULAR | Status: AC
Start: 2020-02-21 — End: ?
  Filled 2020-02-21: qty 5

## 2020-02-21 MED ORDER — DIPHENHYDRAMINE HCL 50 MG/ML IJ SOLN
INTRAMUSCULAR | Status: AC
Start: 2020-02-21 — End: ?
  Filled 2020-02-21: qty 1

## 2020-02-21 MED ORDER — HYDROMORPHONE HCL 2 MG PO TABS
ORAL_TABLET | ORAL | Status: AC
Start: 2020-02-21 — End: ?
  Filled 2020-02-21: qty 1

## 2020-02-21 MED ORDER — MIDAZOLAM HCL 1 MG/ML IJ SOLN (WRAP)
INTRAMUSCULAR | Status: DC | PRN
Start: 2020-02-21 — End: 2020-02-21
  Administered 2020-02-21: 2 mg via INTRAVENOUS

## 2020-02-21 MED ORDER — PROPOFOL INFUSION 10 MG/ML
INTRAVENOUS | Status: DC | PRN
Start: 2020-02-21 — End: 2020-02-21
  Administered 2020-02-21: 08:00:00 25 ug/kg/min via INTRAVENOUS

## 2020-02-21 MED ORDER — GLYCOPYRROLATE 0.2 MG/ML IJ SOLN (WRAP)
INTRAMUSCULAR | Status: AC
Start: 2020-02-21 — End: ?
  Filled 2020-02-21: qty 1

## 2020-02-21 MED ORDER — LIDOCAINE HCL URETHRAL/MUCOSAL 2 % EX GEL
CUTANEOUS | Status: AC
Start: 2020-02-21 — End: ?
  Filled 2020-02-21: qty 0.5

## 2020-02-21 MED ORDER — EPINEPHRINE HCL 1 MG/ML IJ SOLN (WRAP)
Status: AC
Start: 2020-02-21 — End: ?
  Filled 2020-02-21: qty 2

## 2020-02-21 MED ORDER — LIDOCAINE HCL (PF) 1 % IJ SOLN
INTRAMUSCULAR | Status: AC
Start: 2020-02-21 — End: ?
  Filled 2020-02-21: qty 90

## 2020-02-21 MED ORDER — MEPERIDINE HCL 25 MG/ML IJ SOLN
12.5000 mg | Freq: Once | INTRAMUSCULAR | Status: AC | PRN
Start: 2020-02-21 — End: 2020-02-21
  Administered 2020-02-21: 12.5 mg via INTRAVENOUS

## 2020-02-21 MED ORDER — MEPERIDINE HCL 25 MG/ML IJ SOLN
INTRAMUSCULAR | Status: AC
Start: 2020-02-21 — End: ?
  Filled 2020-02-21: qty 1

## 2020-02-21 MED ORDER — CEFAZOLIN SODIUM-DEXTROSE 2-3 GM-%(50ML) IV SOLR
INTRAVENOUS | Status: AC
Start: 2020-02-21 — End: ?
  Filled 2020-02-21: qty 50

## 2020-02-21 MED ORDER — HYDROMORPHONE HCL 2 MG PO TABS
2.0000 mg | ORAL_TABLET | Freq: Once | ORAL | Status: AC
Start: 2020-02-21 — End: 2020-02-21
  Administered 2020-02-21: 12:00:00 2 mg via ORAL

## 2020-02-21 MED ORDER — ONDANSETRON HCL 4 MG/2ML IJ SOLN
4.0000 mg | Freq: Once | INTRAMUSCULAR | Status: AC | PRN
Start: 2020-02-21 — End: 2020-02-21
  Administered 2020-02-21: 4 mg via INTRAVENOUS

## 2020-02-21 MED ORDER — DIPHENHYDRAMINE HCL 50 MG/ML IJ SOLN
INTRAMUSCULAR | Status: DC | PRN
Start: 2020-02-21 — End: 2020-02-21
  Administered 2020-02-21: 12.5 mg via INTRAVENOUS

## 2020-02-21 MED ORDER — SODIUM CHLORIDE 0.9 % IR SOLN
Status: DC | PRN
Start: 2020-02-21 — End: 2020-02-21
  Administered 2020-02-21: 500 mL

## 2020-02-21 MED ORDER — EPINEPHRINE HCL 1 MG/ML IJ SOLN (WRAP)
Status: AC
Start: 2020-02-21 — End: ?
  Filled 2020-02-21: qty 4

## 2020-02-21 MED ORDER — LIDOCAINE HCL 2 % IJ SOLN
INTRAMUSCULAR | Status: DC | PRN
Start: 2020-02-21 — End: 2020-02-21
  Administered 2020-02-21: 100 mg

## 2020-02-21 MED ORDER — FENTANYL CITRATE (PF) 50 MCG/ML IJ SOLN (WRAP)
25.0000 ug | INTRAMUSCULAR | Status: DC | PRN
Start: 2020-02-21 — End: 2020-02-21
  Administered 2020-02-21 (×2): 25 ug via INTRAVENOUS

## 2020-02-21 MED ORDER — LIDOCAINE HCL (PF) 1 % IJ SOLN
INTRAMUSCULAR | Status: AC
Start: 2020-02-21 — End: ?
  Filled 2020-02-21: qty 60

## 2020-02-21 MED ORDER — MIDAZOLAM HCL 1 MG/ML IJ SOLN (WRAP)
INTRAMUSCULAR | Status: AC
Start: 2020-02-21 — End: ?
  Filled 2020-02-21: qty 2

## 2020-02-21 MED ORDER — LACTATED RINGERS IV SOLN
50.0000 mL/h | INTRAVENOUS | Status: DC
Start: 2020-02-21 — End: 2020-02-21

## 2020-02-21 MED ORDER — SCOPOLAMINE 1 MG/3DAYS TD PT72
MEDICATED_PATCH | TRANSDERMAL | Status: AC
Start: 2020-02-21 — End: ?
  Filled 2020-02-21: qty 1

## 2020-02-21 MED ORDER — ACETAMINOPHEN 500 MG PO TABS
1000.0000 mg | ORAL_TABLET | Freq: Once | ORAL | Status: AC
Start: 2020-02-21 — End: 2020-02-21
  Administered 2020-02-21: 1000 mg via ORAL

## 2020-02-21 MED ORDER — CEFAZOLIN SODIUM 1 G IJ SOLR
2.00 g | Freq: Once | INTRAMUSCULAR | Status: AC
Start: 2020-02-21 — End: 2020-02-21
  Administered 2020-02-21: 08:00:00 2 g via INTRAVENOUS

## 2020-02-21 MED ORDER — ROCURONIUM BROMIDE 50 MG/5ML IV SOLN
INTRAVENOUS | Status: AC
Start: 2020-02-21 — End: ?
  Filled 2020-02-21: qty 5

## 2020-02-21 MED ORDER — GABAPENTIN 100 MG PO CAPS
100.0000 mg | ORAL_CAPSULE | Freq: Once | ORAL | Status: AC
Start: 2020-02-21 — End: 2020-02-21
  Administered 2020-02-21: 100 mg via ORAL

## 2020-02-21 MED ORDER — ROCURONIUM BROMIDE 10 MG/ML IV SOLN (WRAP)
INTRAVENOUS | Status: DC | PRN
Start: 2020-02-21 — End: 2020-02-21
  Administered 2020-02-21: 40 mg via INTRAVENOUS

## 2020-02-21 MED ORDER — ONDANSETRON HCL 4 MG/2ML IJ SOLN
INTRAMUSCULAR | Status: AC
Start: 2020-02-21 — End: ?
  Filled 2020-02-21: qty 2

## 2020-02-21 MED ORDER — GLYCOPYRROLATE 0.2 MG/ML IJ SOLN (WRAP)
INTRAMUSCULAR | Status: DC | PRN
Start: 2020-02-21 — End: 2020-02-21
  Administered 2020-02-21: .4 mg via INTRAVENOUS

## 2020-02-21 MED ORDER — PROPOFOL 10 MG/ML IV EMUL (WRAP)
INTRAVENOUS | Status: AC
Start: 2020-02-21 — End: ?
  Filled 2020-02-21: qty 50

## 2020-02-21 MED ORDER — PROMETHAZINE HCL 25 MG/ML IJ SOLN
6.2500 mg | Freq: Once | INTRAMUSCULAR | Status: DC | PRN
Start: 2020-02-21 — End: 2020-02-21

## 2020-02-21 MED ORDER — NEOSTIGMINE METHYLSULFATE 1 MG/ML IJ/IV SOLN (WRAP)
Status: AC
Start: 2020-02-21 — End: ?
  Filled 2020-02-21: qty 5

## 2020-02-21 MED ORDER — LIDOCAINE HCL 1 % IJ SOLN
INTRAMUSCULAR | Status: AC
Start: 2020-02-21 — End: ?
  Filled 2020-02-21: qty 60

## 2020-02-21 MED ORDER — KETAMINE HCL 50 MG/ML IJ SOLN
INTRAMUSCULAR | Status: AC
Start: 2020-02-21 — End: ?
  Filled 2020-02-21: qty 10

## 2020-02-21 MED ORDER — LACTATED RINGERS IV SOLN
INTRAVENOUS | Status: DC
Start: 2020-02-21 — End: 2020-02-21
  Administered 2020-02-21: 1000 mL via INTRAVENOUS

## 2020-02-21 MED ORDER — SCOPOLAMINE 1 MG/3DAYS TD PT72
1.0000 | MEDICATED_PATCH | Freq: Once | TRANSDERMAL | Status: DC
Start: 2020-02-21 — End: 2020-02-21
  Administered 2020-02-21: 1 via TRANSDERMAL

## 2020-02-21 MED ORDER — AMMONIA AROMATIC IN INHA
1.0000 | Freq: Once | RESPIRATORY_TRACT | Status: DC | PRN
Start: 2020-02-21 — End: 2020-02-21

## 2020-02-21 MED ORDER — ACETAMINOPHEN 500 MG PO TABS
ORAL_TABLET | ORAL | Status: AC
Start: 2020-02-21 — End: ?
  Filled 2020-02-21: qty 2

## 2020-02-21 SURGICAL SUPPLY — 55 items
CLOSURE STERI-STRIP 1X5IN (Dressing) ×4
ELECTRODE BLADE (Cautery) ×1
ELECTRODE ELECTROSRGCL BLADE L2.75IN OD3/32 IN L.2IN STD SHAFT HEX LCK (Cautery) ×4 IMPLANT
ELECTRODE ELECTROSURGICAL BLADE L2.75 IN (Cautery) ×4
GLOVE SURGICAL 7.5 BIOGEL PI MICRO (Glove) ×4
GLOVE SURGICAL 7.5 BIOGEL PI MICRO POWDER FREE ROUGH BEAD CUFF (Glove) ×4 IMPLANT
GLOVES BIOGEL PI MICRO SZ 7.5 (Glove) ×1
GOWN PREVNTN+ XLARGE STRL (Gown) ×1
GOWN SURGICAL XL BLUE AAMI LEVEL 4 BREATHABLE CSR WRAP PROTECTION (Gown) ×4 IMPLANT
GOWN SURGICAL XL MEDLINE BLUE AAMI LEVEL (Gown) ×4
NEEDLE 18GA 1-1/2IN (Needles) ×2
NEEDLE HPO PP RW BD PRCSNGL 18GA 1.5IN (Needles) ×8 IMPLANT
PACK SRG UNV ULTRAGARD LF (Pack) ×4
PACK SURGICAL UNIVERSAL ULTRAGARD LATEX FREE (Pack) IMPLANT
PACK ULTRAGRD UNIVERSAL I (Pack) ×1
PAD ABD PVC CRTY 9X5IN LF STRL 3 LYR (Dressing) IMPLANT
PAD PREP CUFF 24X41IN W 9IN (Prep) ×4
PAD SRGPRP 44X24IN NS CUF 9IN (Prep) ×8 IMPLANT
PAD TENDRSRB ABD STRL 5X9IN (Dressing) ×6
POSITIONER HEAD FOAM SLOTTED (Positioning Supplies)
POSITIONER OR L8.5 IN X W8 IN X H4 IN (Positioning Supplies)
POSITIONER OR L8.5 IN X W8 IN X H4 IN HIGH RESILIENT SLOT FOAM HEAD (Positioning Supplies) IMPLANT
SCRUB DYNA-HEX 4% CHG 4OZ (Prep) ×1
SLEEVE CMPR MED KN LGTH KDL SCD 21- IN (Sleeve) ×4
SLEEVE COMPRESSION MEDIUM KNEE LENGTH KENDALL SEQUENTIAL OD21- IN (Sleeve) ×8 IMPLANT
SLEEVE SEQ COMP KNEE REGULAR (Sleeve) ×1
SOLUTION BETADINE 4OZ (Scrub Supplies) ×1
SOLUTION PREP LIQUID ANTIMICROBIAL FLIP (Prep) ×4
SOLUTION PREP LIQUID ANTIMICROBIAL FLIP TOP BOTTLE ANTISEPTIC DYNA-HEX (Prep) ×4 IMPLANT
SOLUTION SRGSCRB 10% PVP IOD 4OZ PLS BTL (Scrub Supplies) ×4
SOLUTION SURGICAL SCRUB 10% PVP IODINE 4OZ PLASTIC BOTTLE AQUEOUS SKIN (Scrub Supplies) IMPLANT
STRAP STRCHR SONTARA PLSTR 60X3IN LF NS (Procedure Accessories) ×4
STRAP STRETCHER L60 IN X W3 IN OR TABLE HOOK SONTARA POLYESTER WHITE (Procedure Accessories) ×4 IMPLANT
STRAP STRETCHER/TABLE 3X60IN (Procedure Accessories) ×1
STRIP SKIN CLOSURE L5 IN X W1 IN (Dressing) ×16
STRIP SKIN CLOSURE L5 IN X W1 IN REINFORCE STERI-STRIP POLYESTER WHITE (Dressing) IMPLANT
SUTURE COATED VICRYL 4-0 FS-2 L27 IN (Suture) IMPLANT
SUTURE ETHILON 5-0 P3 18IN (Suture) ×3
SUTURE ETHILON BLACK 5-0 P-3 L18 IN (Suture) ×12
SUTURE ETHILON BLACK 5-0 P-3 L18 IN MONOFILAMENT NONABSORBABLE (Suture) IMPLANT
SUTURE VICRYL 4-0 FS2 27IN (Suture) ×1
TRAY DRY SKIN SCRUB (Prep) ×2
TRAY PLASTIC SX WOODBURN (Pack) ×1
TRAY SKIN SCRUB 6 WING 6 SPONGE STICK 2 TIP APPLICATOR DRY VINYL (Tray) ×4 IMPLANT
TRAY SKIN SCRUB DRY PREMIUM (Tray) ×1 IMPLANT
TRAY SKIN SCRUB L8 IN 6 WING 6 SPONGE STICK 2 TIP APPLICATOR DRY VINYL (Prep) IMPLANT
TRAY SKIN SCRUB MEDLINE L8 IN VINYL (Prep) ×8
TRAY SKIN SCRUB MEDLINE VINYL COTTON 6 (Tray) ×4 IMPLANT
TRAY SRG PLASTIC SX WOODBURN (Pack) ×4
TRAY SURGICAL PLASTICS WOODBURN (Pack) ×4 IMPLANT
TUBING ASPIRATION 3/8X12 (Tubing) ×1
TUBING INFILTRATION SNG SPIKE (Procedure Accessories) ×1
TUBING SCT SIL SPK 156IN STRL INFLTR SLV (Procedure Accessories) ×4
TUBING SUCTION 156IN SPIKE SLCN INFILTRATION SLV SM BORE STRL DSPSBL (Procedure Accessories) ×4 IMPLANT
TUBING SUCTION L12 FT PSI-TEC (Tubing) IMPLANT

## 2020-02-21 NOTE — Transfer of Care (Signed)
Anesthesia Transfer of Care Note    Patient: Alexandra Parker    Procedures performed: Procedure(s) with comments:  LIPOSUCTION, ABDOMEN (COSMETIC) - LIPOSUCTION TO ABDOMEN, FLANKS, BACK AND INNER THIGHS  LIPOSUCTION, FLANK (COSMETIC)  LIPOSUCTION, BACK (COSMETIC)  LIPOSUCTION, THIGHS (COSMETIC)    Anesthesia type: General ETT    Patient location:Phase I PACU    Last vitals:   Vitals:    02/21/20 0641   BP: 136/80   Pulse: 70   Resp: 18   Temp: 36.6 C (97.9 F)   SpO2: 100%       Post pain: Patient not complaining of pain, continue current therapy      Mental Status:drowsy    Respiratory Function: tolerating face mask    Cardiovascular: stable    Nausea/Vomiting: patient not complaining of nausea or vomiting    Hydration Status: adequate    Post assessment: no apparent anesthetic complications and no reportable events    Signed by: Orlene Plum  02/21/20 11:01 AM

## 2020-02-21 NOTE — Anesthesia Preprocedure Evaluation (Signed)
Anesthesia Evaluation    AIRWAY    Mallampati: II    TM distance: >3 FB  Neck ROM: full  Mouth Opening:full   CARDIOVASCULAR    cardiovascular exam normal, regular and normal       DENTAL    no notable dental hx     PULMONARY    pulmonary exam normal and clear to auscultation     OTHER FINDINGS                  Relevant Problems   No relevant active problems               Anesthesia Plan    ASA 2     general                     intravenous induction   Detailed anesthesia plan: general endotracheal  Monitors/Adjuncts: other    Post Op: other plan for postoperative opioid use    Post op pain management: per surgeon    informed consent obtained    Plan discussed with CRNA.            ===============================================================  Inpatient Anesthesia Evaluation    Patient Name: Alexandra Parker,Alexandra Parker  Surgeon: Gayland Curry., MD  Patient Age / Sex: 54 y.o. / female    Medical History:     Past Medical History:   Diagnosis Date    Low back pain     on occasion       Past Surgical History:   Procedure Laterality Date    ABDOMINOPLASTY  2004    ABDOMINOPLASTY, MINI (COSMETIC) N/A 09/04/2014    Procedure: ABDOMINOPLASTY, MINI (COSMETIC);  Surgeon: Guilford Shi, MD;  Location: [facility] OR;  Service: Plastics;  Laterality: N/A;  MINI ABDOMINOPLASTY W/ LIPOSUCTION ABDOMEN(COSMETIC)    CESAREAN SECTION      x 3    COLONOSCOPY      HYSTERECTOMY      LIPOSUCTION  2004    LIPOSUCTION, FLANK (COSMETIC) Bilateral 09/04/2014    Procedure: LIPOSUCTION, FLANK (COSMETIC);  Surgeon: Guilford Shi, MD;  Location: [facility] OR;  Service: Plastics;  Laterality: Bilateral;  LIPOSUCTION,  FLANKS AND BRA LINE (COSMETIC)    TONSILLECTOMY      WRIST SURGERY Right 06/06/2019         Allergies:     Allergies   Allergen Reactions    Percocet [Oxycodone-Acetaminophen] Itching         Medications:     Current Facility-Administered Medications   Medication Dose Route Frequency Last Rate Last Admin    lactated  ringers infusion   Intravenous Continuous 20 mL/hr at 02/21/20 0656 1,000 mL at 02/21/20 0656    scopolamine (TRANSDERM-SCOP) patch (1.5 mg) 1 mg/72 hrs 1 patch  1 patch Transdermal Once   1 patch at 02/21/20 0646              Prior to Admission medications    Medication Sig Start Date End Date Taking? Authorizing Provider   melatonin 10 mg tablet Take 10 mg by mouth nightly as needed for Sleep   Yes [provider]     Vitals   Temp:  [36.6 C (97.9 F)] 36.6 C (97.9 F)  Heart Rate:  [70] 70  Resp Rate:  [18] 18  BP: (136)/(80) 136/80    Wt Readings from Last 3 Encounters:   02/21/20 90.7 kg (200 lb)   08/30/14 90.7 kg (200 lb)  BMI (Estimated body mass index is 29.53 kg/m as calculated from the following:    Height as of this encounter: 1.753 m (5\' 9" ).    Weight as of this encounter: 90.7 kg (200 lb).)  Temp Readings from Last 3 Encounters:   02/21/20 36.6 C (97.9 F) (Temporal)   09/04/14 36.5 C (97.7 F) (Temporal Artery)     BP Readings from Last 3 Encounters:   02/21/20 136/80   09/04/14 144/78     Pulse Readings from Last 3 Encounters:   02/21/20 70   09/04/14 90           Labs:   CBC:  No results found for: WBC, HGB, HCT, PLT    Chemistries:  No results found for: NA, K, CL, CO2, BUN, CREAT, GLU, HGBA1C, CA, AST    Coags:  No results found for: PT, PTT, INR  _____________________      Signed by: Florestine Avers  02/21/20   7:02 AM    =============================================================           Signed by: Florestine Avers 02/21/20 7:02 AM

## 2020-02-21 NOTE — Discharge Instr - AVS First Page (Addendum)
Reason for your Hospital Admission:  ***    Today for pain control you were given Tylenol 1,000 mg and Gabapentin 100 mg at 6:45 am  Also: Dilaudid 2 mg by mouth at 11:40 am      You have been given a scopolamine patch for nausea prevention.  This is located behind your __left______ear.  This may be removed in 3 days __________  or sooner if you prefer.  Avoid touching or rubbing your eyes after removal and wash your hands well, as the medication in the patch can cause pupil dilatation.    You were given an antibiotic, Ancef, at 7:45 am      Instructions for after your discharge:  Follow post operative instructions given prior to surgery.  Keep head of bed elevated 45 degrees-do not lay flat.  Take prescriptions as prescribed.  Leave dressings in place until otherwise directed by surgeon.  Advance diet as tolerated.    Anesthesia: After Your Surgery  Youve just had surgery. During surgery, you received medication called anesthesia to keep you comfortable and pain-free. After surgery, you may experience some pain or nausea. This is normal. Here are some tips for feeling better and recovering after surgery.     Stay on schedule with your medication.   Going Home  Your doctor or nurse will show you how to take care of yourself when you go home. He or she will also answer your questions. Have an adult family member or friend drive you home. For the first 24 hours after your surgery:   Do not drive or use heavy equipment.   Do not make important decisions or sign documents.   Avoid alcohol.   Have someone stay with you, if possible. They can watch for problems and help keep you safe.  Be sure to keep all follow-up doctors appointments. And rest after your procedure for as long as your doctor tells you to.    Coping with Pain  If you have pain after surgery, pain medication will help you feel better. Take it as directed, before pain becomes severe. Also, ask your doctor or pharmacist about other ways to control  pain, such as with heat, ice, and relaxation. And follow any other instructions your surgeon or nurse gives you.    Tips for Taking Pain Medication  To get the best relief possible, remember these points:   Pain medications can upset your stomach. Taking them with a little food may help.   Most pain relievers taken by mouth need at least 20 to 30 minutes to take effect.   Taking medication on a schedule can help you remember to take it. Try to time your medication so that you can take it before beginning an activity, such as dressing, walking, or sitting down for dinner.   Constipation is a common side effect of pain medications. Drink lots of fluids. Eating fruit and vegetables can also help. Dont take laxatives unless your surgeon has prescribed them.   Mixing alcohol and pain medication can cause dizziness and slow your breathing. It can even be fatal. Dont drink alcohol while taking pain medication.   Pain medication can slow your reflexes. Dont drive or operate machinery while taking pain medication,    Managing Nausea  Some people have an upset stomach after surgery. This is often due to anesthesia, pain, pain medications, or the stress of surgery. The following tips will help you manage nausea and get good nutrition as you recover. If you were  on a special diet before surgery, ask your doctor if you should follow it during recovery. These tips may help:   Dont push yourself to eat. Your body will tell you what to eat and when.   Start off with liquids and soup. They are easier to digest.   Progress to semisolids (mashed potatoes, applesauce, and gelatin) as you feel ready.   Slowly move to solid foods. Dont eat fatty, rich, or spicy foods at first.   Dont force yourself to have three large meals a day. Instead, eat smaller amounts more often.   Take pain medications with a small amount of solid food, such as crackers or toast.    Call Your Surgeon If   You still have pain an hour after  taking medication (it may not be strong enough).   You feel too sleepy, dizzy, or groggy (medication may be too strong).   You have side effects like nausea, vomiting, or skin changes (rash, itching, or hives).   Signs of infection - fever over 101, chills. Incision site redness, warmth, swelling, foul odor or purulent drainage.   Persistent bleeding at the operative site.    [address] [place], [place] [address]. All rights reserved. This information is not intended as a substitute for professional medical care. Always follow your healthcare professional's instructions.        ***

## 2020-02-21 NOTE — Brief Op Note (Signed)
Procedure Note    Pre Op: Lipodystrophy abdomen,back, flanks, posterior hips, and thighs  Post Op: Same    Procedure: Liposuction abdomen, back, flanks, posterior hips, and thighs    Surgeon: Lucious Groves    Anesthesia: LMA general    Blood loss: Minimal to appropriate    Tumescent: 5000 cc  Fatty Aspirate: 6400 cc    Patient tolerated procedure well.    Lucious Groves

## 2020-02-21 NOTE — H&P (Signed)
Alexandra Parker is a 54 year old female who presents for elective surgery to improve appearance.      Medical Hx:     No active medical problems  Surgical Hx: Abdominoplasty, liposuction, c section deliveries, wrist surgery    Medications:   Melatonin  Allergies: Percocet    ROS:  Pulmonary Neg    Cardiac:  Neg    Musculoskeletal; Neg    Neuro:  Neg    Exam:  NL height, moderate obese female    Lungs: clear    Cardiac:  [id number]    Abdomen:   General lipodystrophy abdomen, flanks, back and inner thighs    Neuro:  grossly intact    Plan: Liposuction abdomen, flanks, back, posterior hips, and inner thighs      Lucious Groves MD

## 2020-02-21 NOTE — Anesthesia Postprocedure Evaluation (Signed)
Anesthesia Post Evaluation    Patient: Alexandra Parker    Procedure(s) with comments:  LIPOSUCTION, ABDOMEN (COSMETIC) - LIPOSUCTION TO ABDOMEN, FLANKS, BACK AND INNER THIGHS  LIPOSUCTION, FLANK (COSMETIC)  LIPOSUCTION, BACK (COSMETIC)  LIPOSUCTION, THIGHS (COSMETIC)    Anesthesia type: general    Last Vitals:   Vitals Value Taken Time   BP 124/63 02/21/20 1140   Temp 36.3 C (97.4 F) 02/21/20 1140   Pulse 68 02/21/20 1140   Resp 13 02/21/20 1140   SpO2 98 % 02/21/20 1140                 Anesthesia Post Evaluation:     Patient Evaluated: PACU  Patient Participation: complete - patient participated  Level of Consciousness: awake and alert  Pain Score: 5  Pain Management: adequate    Airway Patency: patent    Anesthetic complications: No      PONV Status: none    Cardiovascular status: acceptable  Respiratory status: acceptable  Hydration status: acceptable        Signed by: Florestine Avers, 02/21/2020 12:03 PM

## 2020-02-27 NOTE — Op Note (Signed)
Procedure Date: 02/21/2020     Patient Type: A     SURGEON: Guilford Shi MD  ASSISTANT:       PREOPERATIVE DIAGNOSES:  1.  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  2.  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  3.  Lipodystrophy of the thighs.     PREOPERATIVE DIAGNOSES:  1.  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  2.  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  3.  Lipodystrophy of the thighs.     TITLE OF PROCEDURE:  1.  Liposuction of the abdomen and lateral flanks.  2.  Liposuction of the posterior hips, buttocks and back.  3.  Liposuction of the medial and anterior hips.     ANESTHESIA:  General endotracheal.     INDICATIONS FOR PROCEDURE:  Alexandra Parker is a 54 year old female who presents for elective  liposuction surgery to improve the appearance of her abdomen and lateral  flanks following abdominoplasty.  She is also electing to undergo  liposuction of the posterior back and bra line as well as the posterior  hips and gluteal region.  Lastly, she is also electing to undergo  liposuction of the medial thighs, anterior extension and just a slight  amount of  lateral thigh reduction.  We carefully reviewed the procedure  with her, including the postoperative instructions.  Risks and  complications were discussed.  All of her questions were answered.  She is  a good candidate for this procedure.     DESCRIPTION OF PROCEDURE:  The patient was carefully marked in the preoperative holding, the areas of  lipodystrophy and targeted areas for reduction.  She was then brought to  the operating room, placed in comfortable supine position.  After  establishing IV access and general endotracheal intubation, she was then  converted to a prone position on padded arm boards and chest rolls.  The  back and lateral flanks, and buttocks and medial thighs were draped and  prepped in sterile fashion with chlorhexidine and alcohol.  Tumescent  solution was then infused through strategic puncture  wounds along the  posterior back, lateral flanks, the buttock and hip areas as well as the  thighs through using small incisions, tumescent solution was then infused  sequentially as we progressed from tumescent to liposuction.  Careful  monitoring the amount of fluid injected into each area was performed during  the procedure for balance and symmetry between the 2 sides.  Following the  initial injection of tumescent along the posterior back and lateral flanks,  liposuction was then performed through small puncture incisions bilaterally  until a satisfactory reduction had been achieved.  Again, the amount of  fatty accumulation from each individual side was carefully monitored for  symmetry.  We then directed the tumescent and the standard liposuction to  the posterior hips and peri gluteal area.  Following sufficient time for  the tumescent to have its desired effect, liposuction was then performed  bilaterally until a satisfactory shape and contour had been achieved and  reduction of the amount of lipodystrophy.     I then proceeded to the medial thighs where tumescent solution was infused,  as well as along the posterior and lateral portion of the hip and thighs.   Following sufficient time for the tumescent to have its desired effect,  liposuction was then performed, reducing the lipodystrophy to a pleasing  shape and contour and reduction in the amount of fatty tissue.  The  incisions were closed with 5-0 nylon suture.     She was then converted under the direction of anesthesia to a comfortable  supine position.  The abdomen and anterior lateral flanks, as well as the  thighs were draped and prepped in sterile fashion with chlorhexidine and  alcohol.  Tumescent solution was infused throughout the abdomen and the  anterior flanks and it was also infused along the anterior thighs and  anterolateral thigh.  Following sufficient time for the tumescent to have  its desired effect, standard liposuction was then  performed of the abdomen,  and the anterior lateral thighs as well as the anterior medial and lateral  thighs until a satisfactory reduction and contouring had been accomplished.   The incisions were closed with 5-0 nylon suture.  Dressings including a  compression garment were then applied.  The patient was taken to recovery  having tolerated the procedure well in satisfactory condition.  Sponge and  needle count reported correct.  A total of 5000 mL of tumescent solution  was then infused and a total of 6450 mL of fatty aspirate had been  returned.       It should be noted that prior to the induction of anesthesia, pneumatic  stockings have been applied on the lower extremities and activated as well  as the infusion of IV antibiotics.           D:  02/26/2020 12:09 PM by Dr. Guilford Shi, MD [phone])  T:  02/27/2020 02:33 AM by NTS      Everlean Cherry: [id number]) (Doc ID: [id number])
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
## 2022-07-13 IMAGING — MR MRI RIGHT SHOULDER WITHOUT CONTRAST
4 of 6 series · 24 of 40 positions shown · IV contrast (gadolinium)
Comparison: None

________________________________________________________________________________________________ 
MRI RIGHT SHOULDER WITHOUT CONTRAST, 07/13/2022 [DATE]: 
CLINICAL INDICATION: Pain in right shoulder.
TECHNIQUE: Multiplanar, multiecho position MR images of the shoulder were 
performed without intravenous gadolinium enhancement. Patient was scanned on a 
1.5T magnet.

[Series 201: survey right · axial · right · 10.0mm · 0.71mm/px · z∈[-40,+125]mm · 5 of 15 slices shown]
[im 1/15]
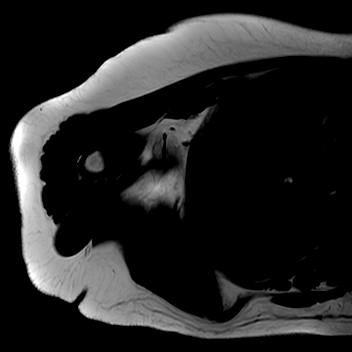
[im 4/15]
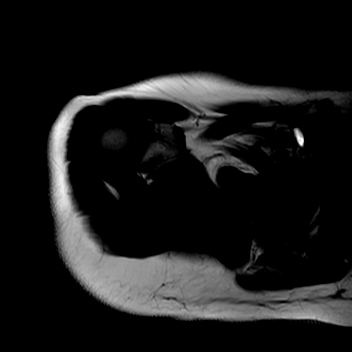
[im 8/15]
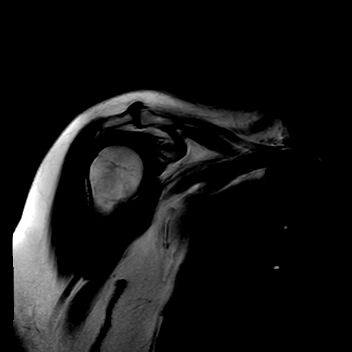
[im 11/15]
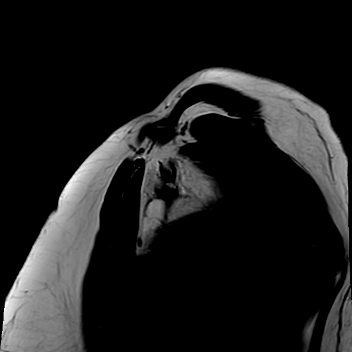
[im 15/15]
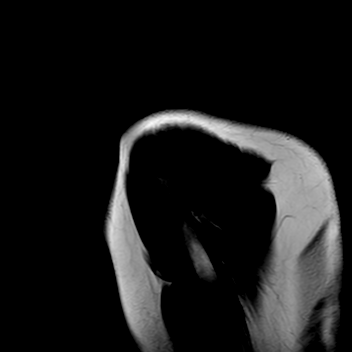

[Series 301: (person_name)_(person_name)_(person_name) right · axial · right · 3.5mm · 0.42mm/px · z∈[-41,+66]mm · 7 of 28 slices shown]
[im 1/28]
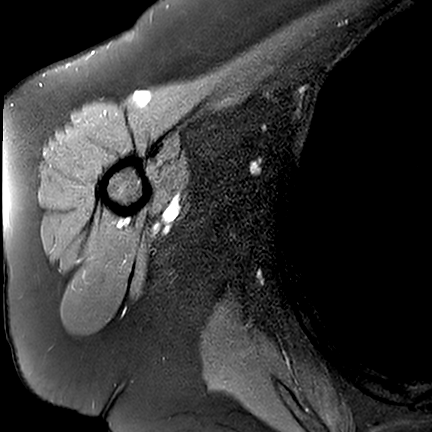
[im 5/28]
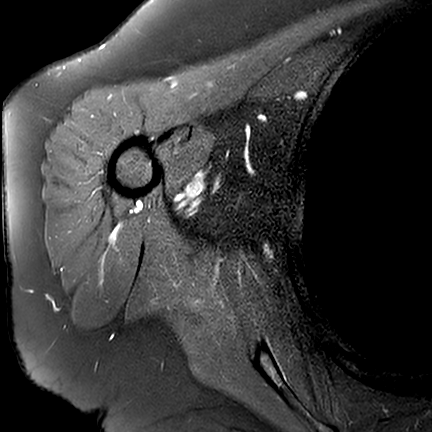
[im 10/28]
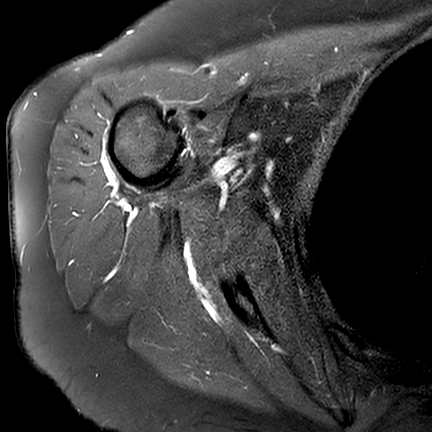
[im 14/28]
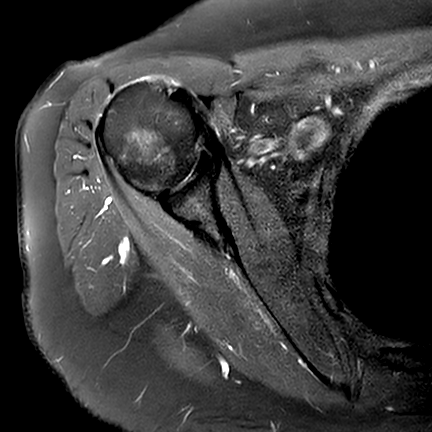
[im 19/28]
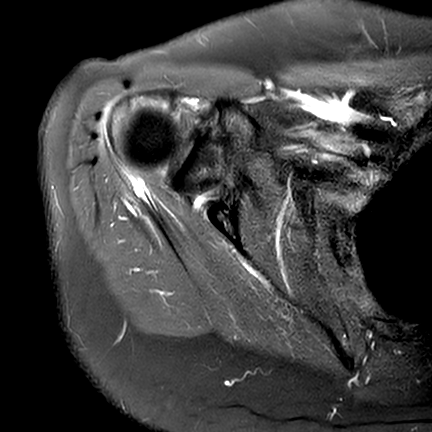
[im 23/28]
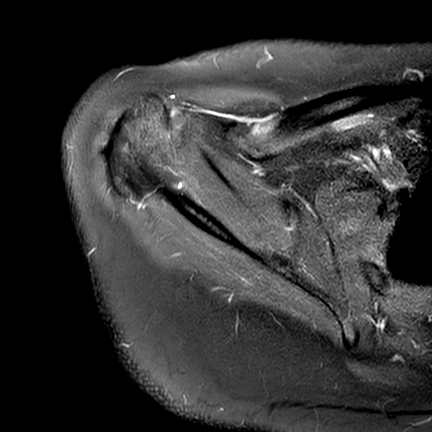
[im 28/28]
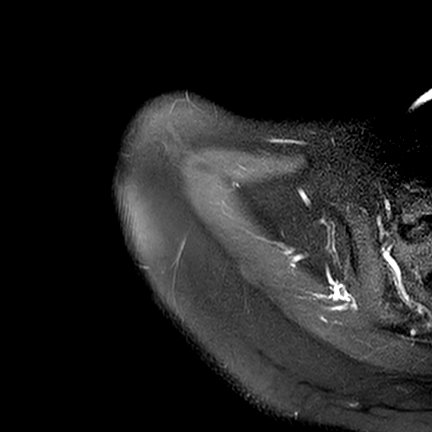

[Series 401: t2_fs_sag right · coronal · right · 3.5mm · 0.35mm/px · 8 of 30 slices shown]
[im 1/30]
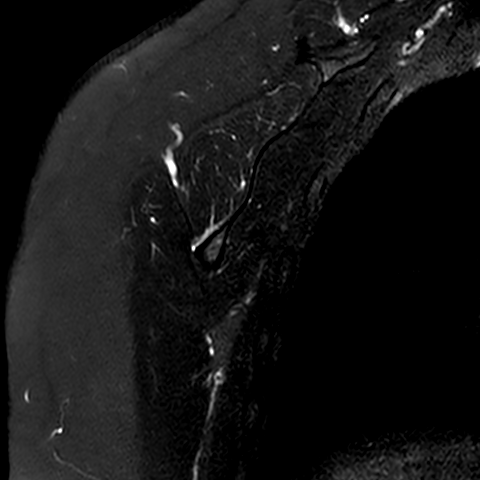
[im 5/30]
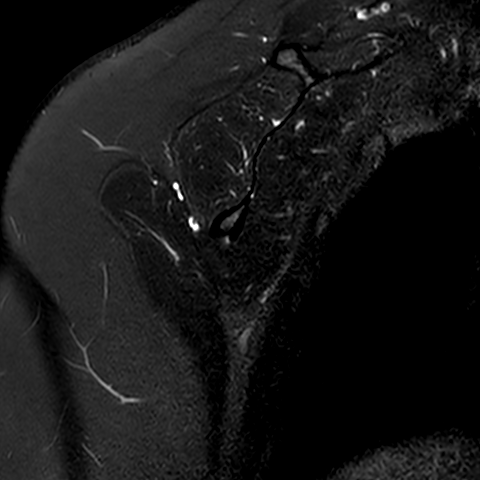
[im 9/30]
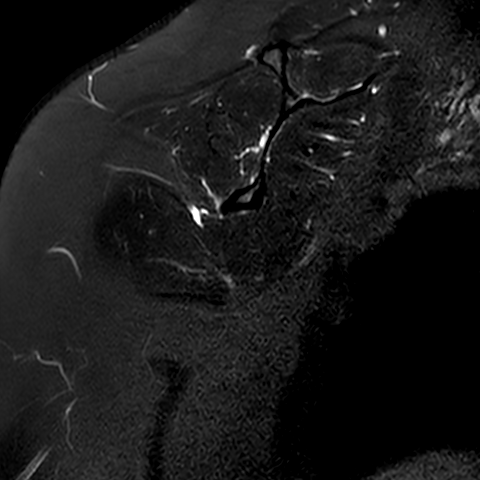
[im 13/30]
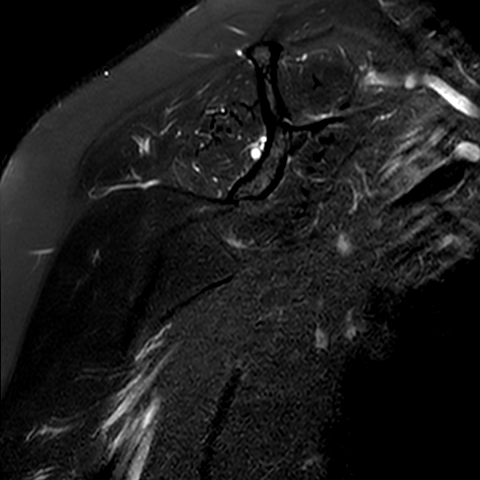
[im 17/30]
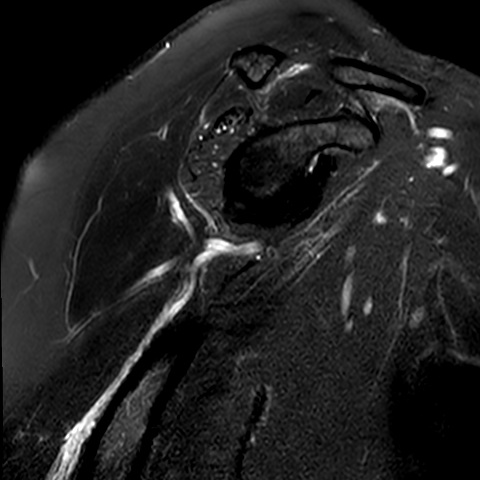
[im 21/30]
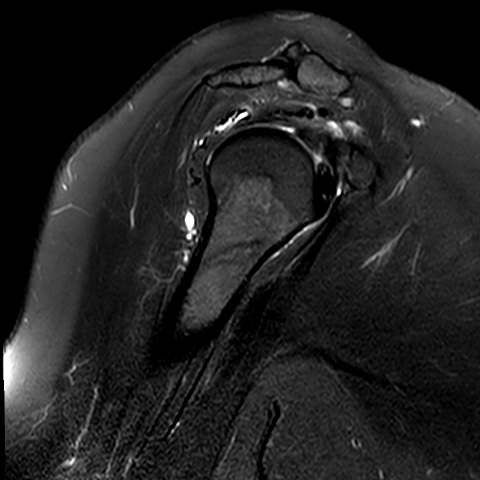
[im 25/30]
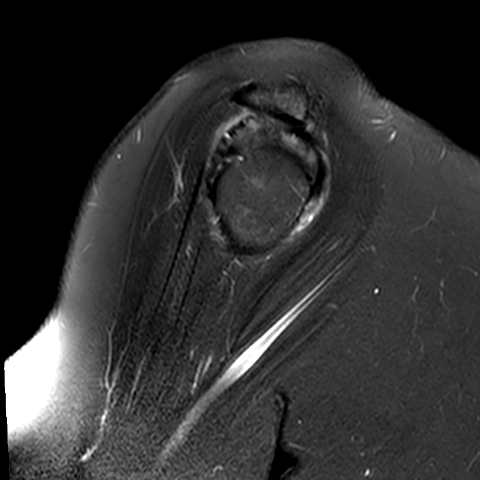
[im 30/30]
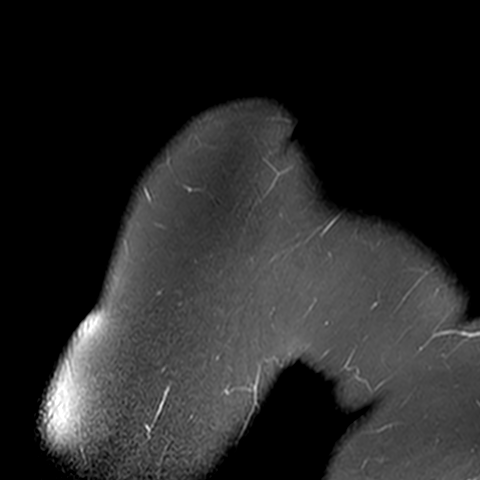

[Series 501: pd_fs_cor right · oblique · right · 3.5mm · 0.38mm/px · 4 of 24 slices shown]
[im 1/24]
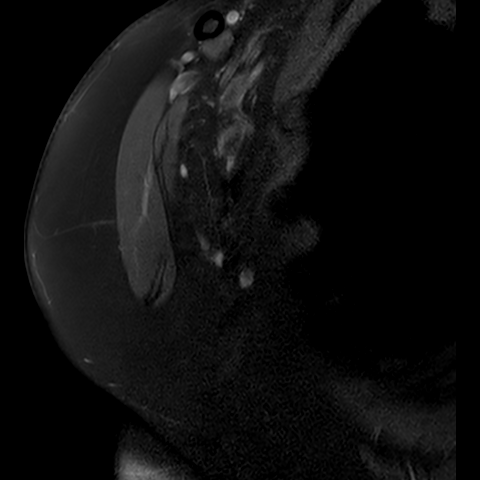
[im 5/24]
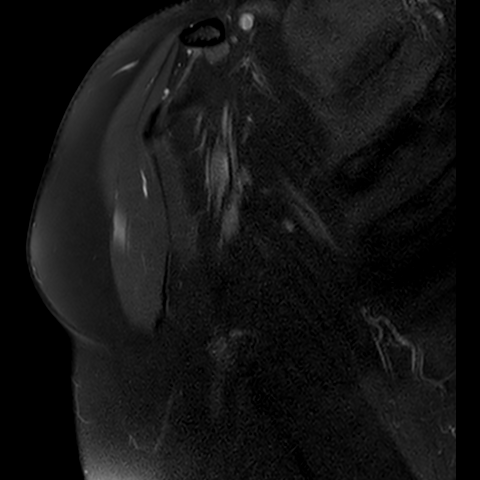
[im 14/24]
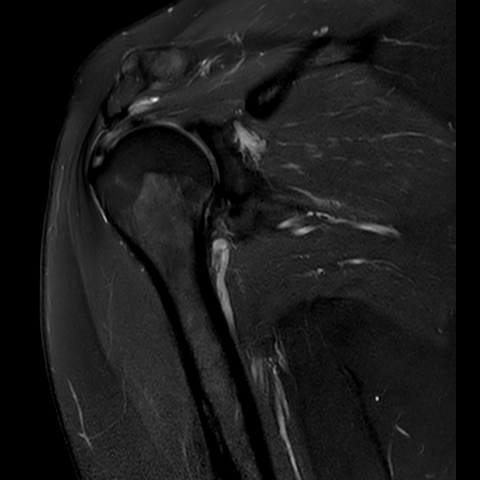
[im 24/24]
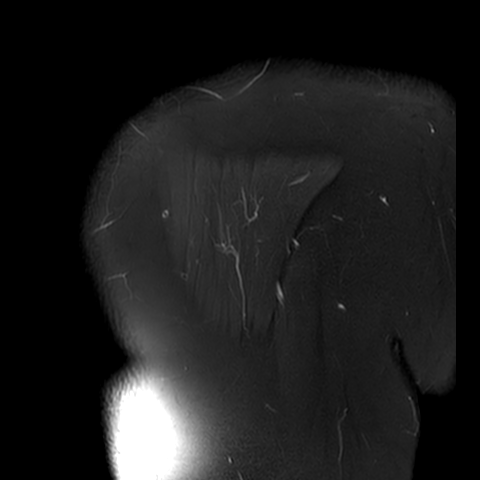

[24 of 40 positions shown; findings below may reference images not displayed]

FINDINGS: ROTATOR CUFF: High-grade bursal sided tear of the distal supraspinatus tendon 
measures 0.8 cm in AP dimensions. 0.8 cm segment concealed, intrasubstance tear 
of the conjoined supraspinatus/infraspinatus tendon. Small amount of fluid 
extends along the infraspinatus myotendinous junction. Low-grade 
partial-thickness distal subscapularis tendon measures 0.5 cm in height. The 
teres minor tendon is intact. The rotator cuff musculature is symmetric without 
mass, signal abnormality or atrophy. 
ACROMIOCLAVICULAR JOINT: Mild degenerative change of the acromioclavicular joint 
without mass effect upon the supraspinatus. The coracoacromial ligament is 
intact without prominent spurring at the acromial attachment. The 
acromioclavicular and coracoclavicular ligaments are preserved. The acromium is 
normal in morphology. 
GLENOHUMERAL JOINT: The humeral head is well located within the glenoid fossa. 
Articular cartilage is preserved.  The glenoid labrum is preserved. No 
paralabral cyst. The intra-articular portion of the long head of the biceps 
tendon is negative. No shoulder joint effusion. 
BONES: The bone marrow signal intensity is negative for fracture. No Hill-Sachs 
defect. Subcortical cystic change of the humeral head. 
ADDITIONAL FINDINGS: Trace amount of fluid in the subacromial/subdeltoid bursa. 
The axillary region is negative. Subcutaneous tissues are negative.
IMPRESSION: 1.  0.8 cm high-grade bursal sided distal supraspinatus tendon tear.  
2.  0.8 cm segment concealed, intrasubstance tear of the conjoined 
supraspinatus/infraspinatus tendon.  
3.  0.5 cm low-grade partial-thickness distal subscapularis tendon tear.  
4.  Mild AC joint degenerative change without mass effect upon the 
supraspinatus.

## 2023-09-19 IMAGING — CT CT CALCIUM SCORING
1 series · 15 of 20 positions shown, 19 images · non-contrast
Comparison: There are no previous exams available for comparison.

________________________________________________________________________________________________ 
CT CALCIUM SCORING, 09/19/2023 [DATE]:
INDICATION: Hyperlipidemia, unspecified

[Series 2: cascoreseq 3.0 b35s 60% · axial · 0.35mm/px · z∈[+1014,+1134]mm · 15 of 90 slices shown, 19 images]
[im 5/90  vessel]
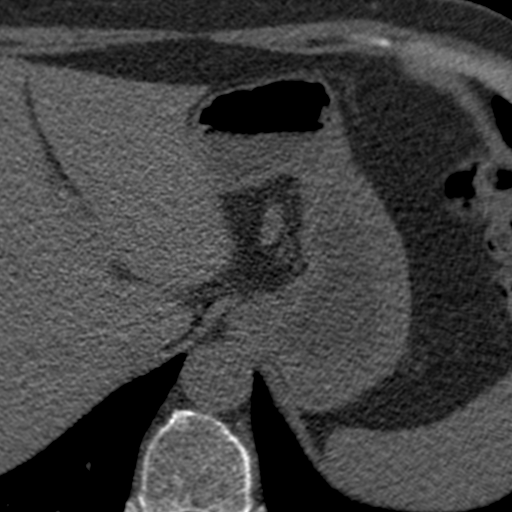
[im 5/90  lung]
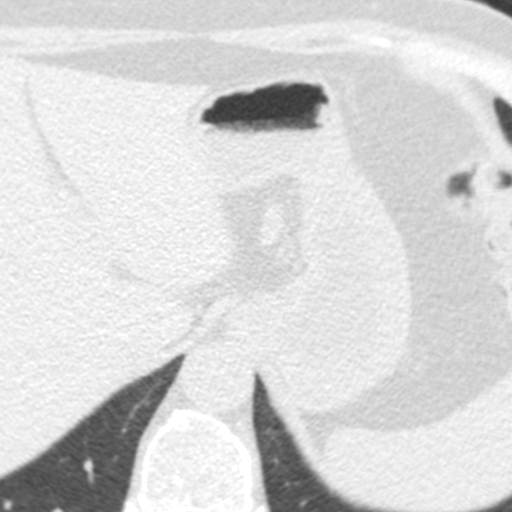
[im 10/90  vessel]
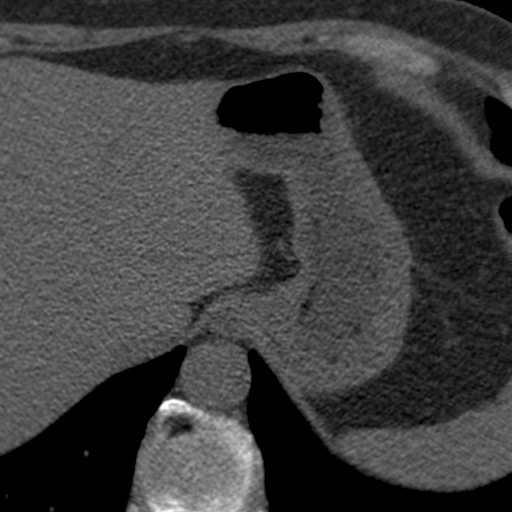
[im 19/90  vessel]
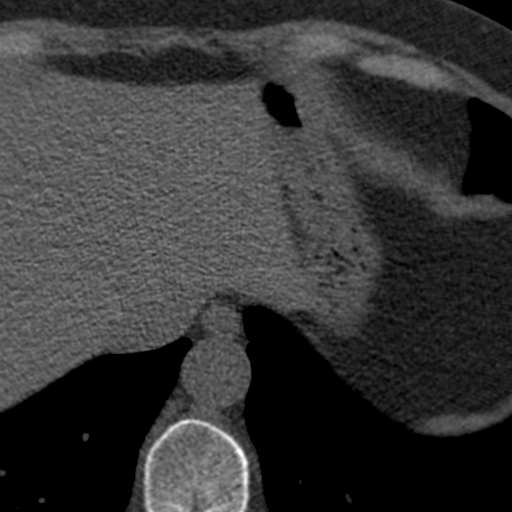
[im 24/90  vessel]
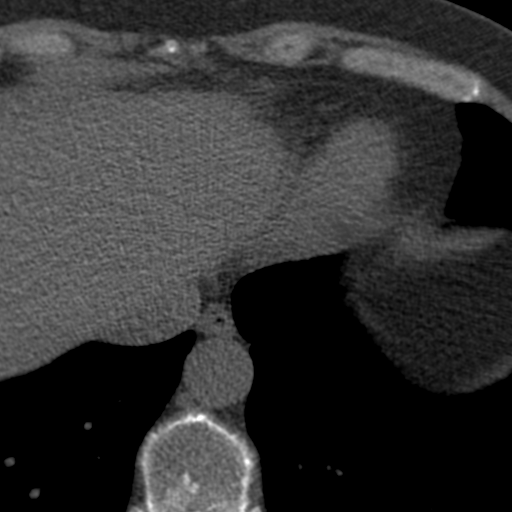
[im 29/90  vessel]
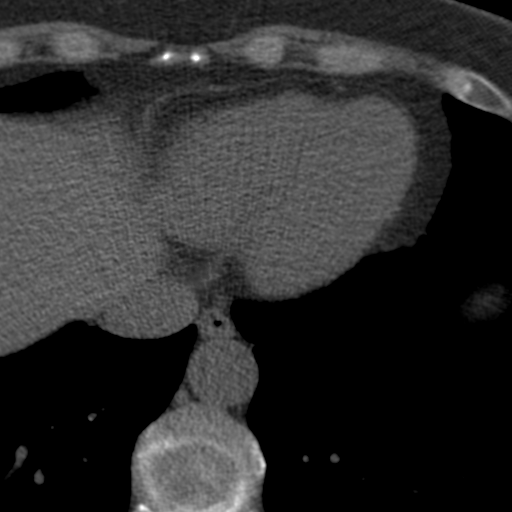
[im 29/90  lung]
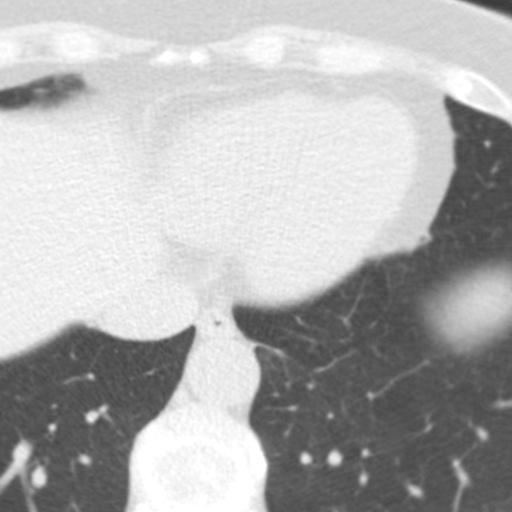
[im 33/90  vessel]
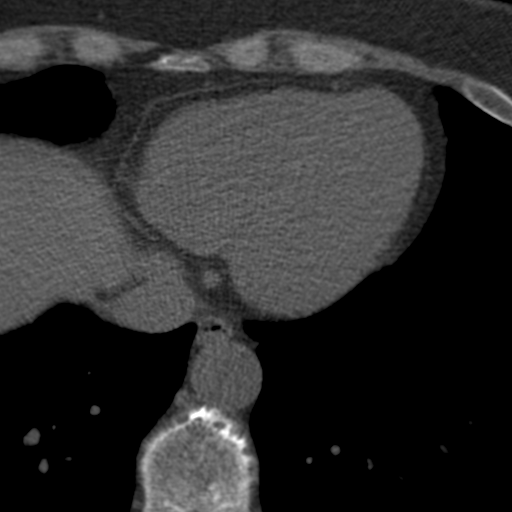
[im 38/90  vessel]
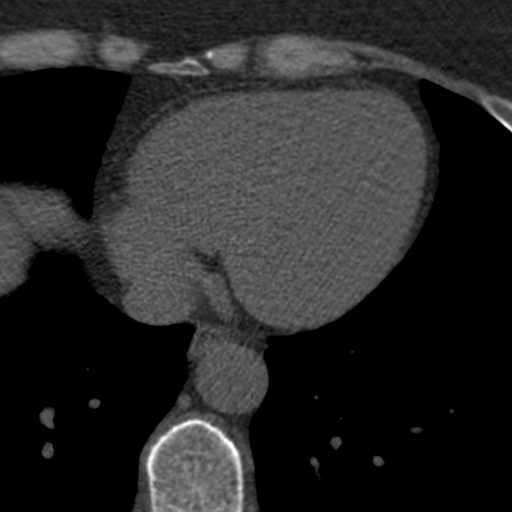
[im 47/90  vessel]
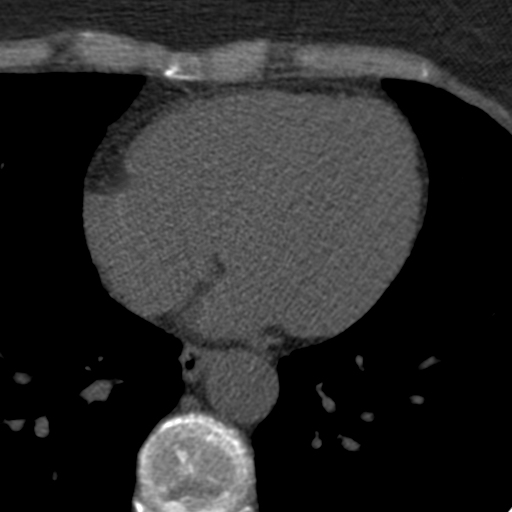
[im 52/90  vessel]
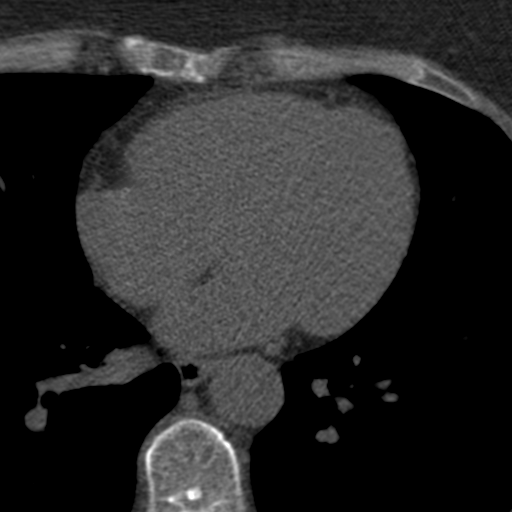
[im 52/90  lung]
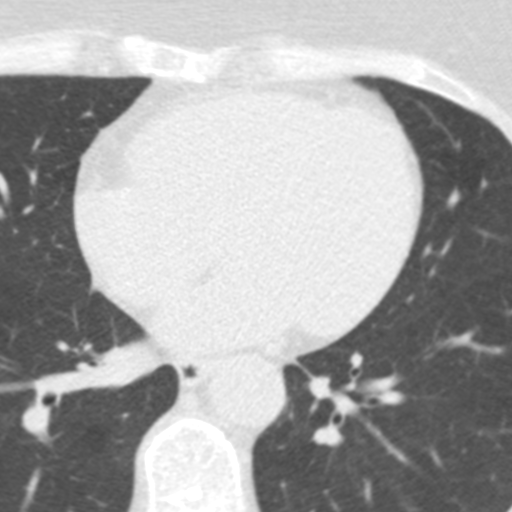
[im 57/90  vessel]
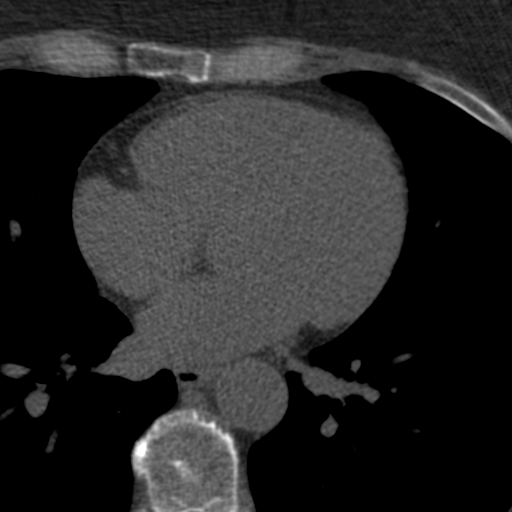
[im 61/90  vessel]
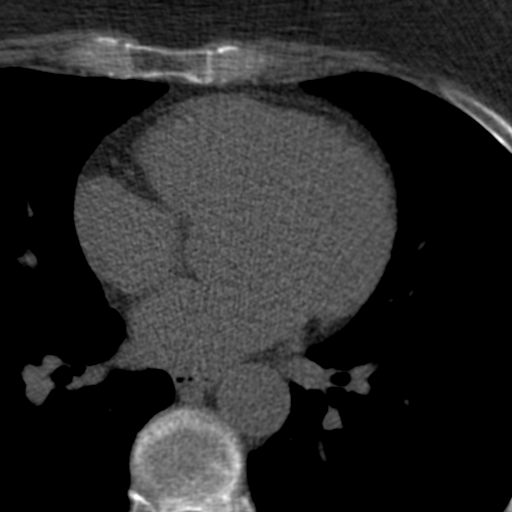
[im 66/90  vessel]
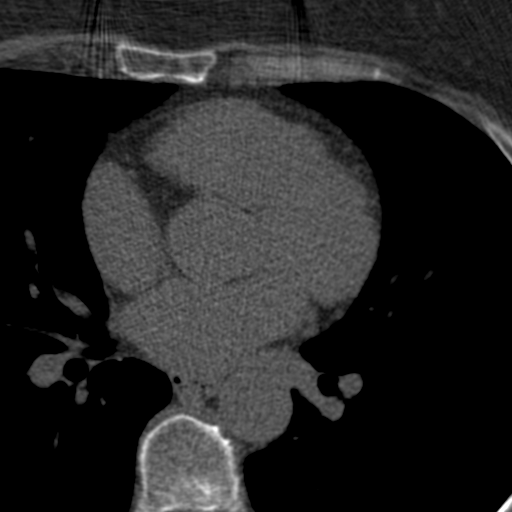
[im 75/90  vessel]
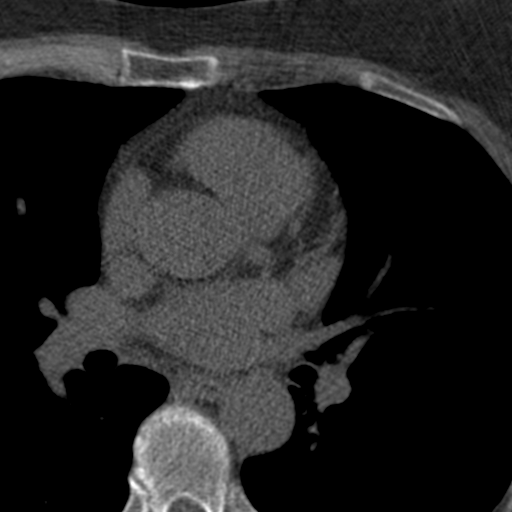
[im 75/90  lung]
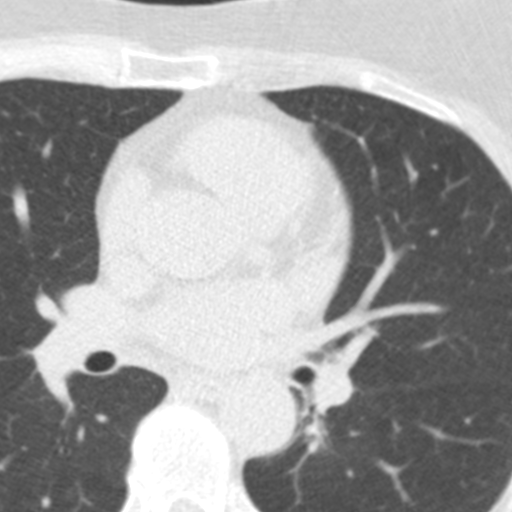
[im 80/90  vessel]
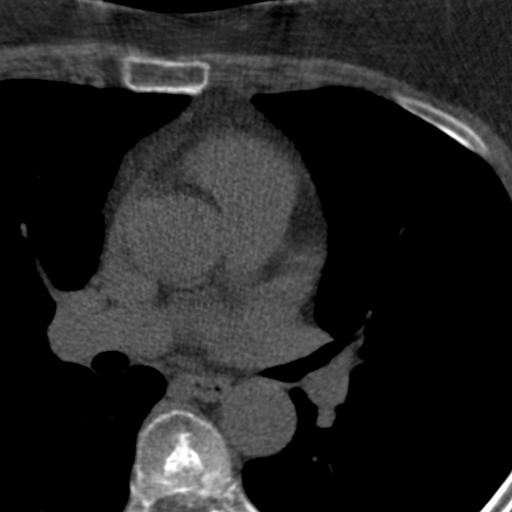
[im 85/90  vessel]
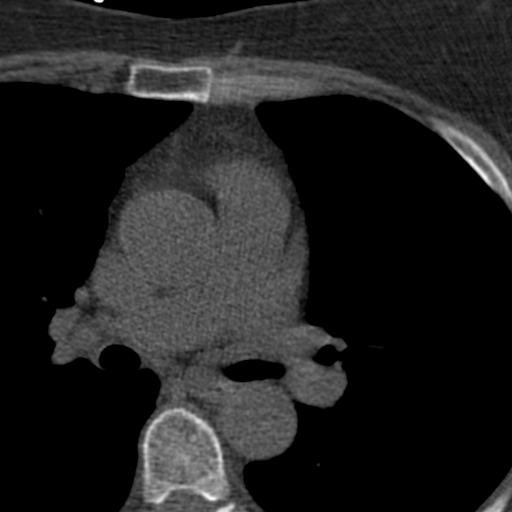

[15 of 20 positions shown; findings below may reference images not displayed]

Count of known CT and Cardiac Nuclear Medicine studies performed in the previous 
12 months = 0
FINDINGS: Visualized lungs are clear.  
Left Main (LM) Score: 0 
Left Anterior Descending Artery (LAD) Score: 0 
Circumflex (CM) Score: 0 
Right Coronary Artery (RCA) Score: 0 
Posterior Descending Artery (PDA) Score: 0
IMPRESSION: Total Calcium Score: 0 
If calcium score is greater than 100 and less than 6666, would recommend 
follow-up with coronary CTA with Cleerly plaque analysis. 
Calcium scoring sheets to follow. 
RADIATION DOSE REDUCTION: All CT scans are performed using radiation dose 
reduction techniques, when applicable.  Technical factors are evaluated and 
adjusted to ensure appropriate moderation of exposure.  Automated dose 
management technology is applied to adjust the radiation doses to minimize 
exposure while achieving diagnostic quality images. 
Consider further evaluation if multi-vessel or left-main predominant disease is 
present. 
Calcium score                                     Presence of Plaque 
0                                                    No evidence of plaque 
1-10                                               Minimal evidence of plaque 
11-100                                            Mild evidence of plaque 
101-400                                          Moderate evidence of plaque 
Over 400                                        Extensive evidence of plaque
